# Patient Record
Sex: Female | Born: 1950 | Race: Black or African American | Hispanic: No | Marital: Single | Smoking: Former smoker
Health system: Southern US, Community
[De-identification: ages and names within clinical notes are randomized; demographics above are authoritative.]

## PROBLEM LIST (undated history)

## (undated) DIAGNOSIS — Z8619 Personal history of other infectious and parasitic diseases: Secondary | ICD-10-CM

## (undated) DIAGNOSIS — H409 Unspecified glaucoma: Secondary | ICD-10-CM

## (undated) DIAGNOSIS — E119 Type 2 diabetes mellitus without complications: Secondary | ICD-10-CM

## (undated) DIAGNOSIS — T7840XA Allergy, unspecified, initial encounter: Secondary | ICD-10-CM

## (undated) DIAGNOSIS — I10 Essential (primary) hypertension: Secondary | ICD-10-CM

## (undated) DIAGNOSIS — Z8719 Personal history of other diseases of the digestive system: Secondary | ICD-10-CM

## (undated) DIAGNOSIS — E785 Hyperlipidemia, unspecified: Secondary | ICD-10-CM

## (undated) HISTORY — PX: CARPAL TUNNEL RELEASE: SHX101

## (undated) HISTORY — DX: Type 2 diabetes mellitus without complications: E11.9

## (undated) HISTORY — DX: Personal history of other infectious and parasitic diseases: Z86.19

## (undated) HISTORY — PX: NECK SURGERY: SHX720

## (undated) HISTORY — PX: CHOLECYSTECTOMY: SHX55

## (undated) HISTORY — DX: Personal history of other diseases of the digestive system: Z87.19

## (undated) HISTORY — PX: TUBAL LIGATION: SHX77

## (undated) HISTORY — DX: Allergy, unspecified, initial encounter: T78.40XA

## (undated) HISTORY — DX: Essential (primary) hypertension: I10

## (undated) HISTORY — DX: Hyperlipidemia, unspecified: E78.5

---

## 2005-09-16 ENCOUNTER — Inpatient Hospital Stay (HOSPITAL_COMMUNITY): Admission: EM | Admit: 2005-09-16 | Discharge: 2005-09-19 | Payer: Self-pay | Admitting: Emergency Medicine

## 2006-02-16 ENCOUNTER — Ambulatory Visit (HOSPITAL_COMMUNITY): Admission: RE | Admit: 2006-02-16 | Discharge: 2006-02-16 | Payer: Self-pay | Admitting: General Surgery

## 2014-01-16 ENCOUNTER — Encounter: Payer: Self-pay | Admitting: Physician Assistant

## 2014-07-20 ENCOUNTER — Encounter: Payer: Self-pay | Admitting: Physician Assistant

## 2014-09-29 ENCOUNTER — Encounter: Payer: Self-pay | Admitting: Physician Assistant

## 2014-10-24 ENCOUNTER — Encounter: Payer: Self-pay | Admitting: Physician Assistant

## 2014-11-09 HISTORY — PX: TUMOR REMOVAL: SHX12

## 2014-11-23 ENCOUNTER — Encounter: Payer: Self-pay | Admitting: Physician Assistant

## 2014-12-18 ENCOUNTER — Encounter: Payer: Self-pay | Admitting: Physician Assistant

## 2015-08-30 ENCOUNTER — Encounter: Payer: Self-pay | Admitting: Physician Assistant

## 2017-12-21 DIAGNOSIS — H401331 Pigmentary glaucoma, bilateral, mild stage: Secondary | ICD-10-CM | POA: Diagnosis not present

## 2018-01-18 DIAGNOSIS — H401131 Primary open-angle glaucoma, bilateral, mild stage: Secondary | ICD-10-CM | POA: Diagnosis not present

## 2018-02-23 ENCOUNTER — Encounter: Payer: Self-pay | Admitting: Physician Assistant

## 2018-02-23 ENCOUNTER — Ambulatory Visit (INDEPENDENT_AMBULATORY_CARE_PROVIDER_SITE_OTHER): Payer: Medicare HMO | Admitting: Physician Assistant

## 2018-02-23 VITALS — BP 162/74 | HR 74 | Temp 97.4°F | Ht 63.0 in | Wt 200.6 lb

## 2018-02-23 DIAGNOSIS — Z0000 Encounter for general adult medical examination without abnormal findings: Secondary | ICD-10-CM

## 2018-02-23 DIAGNOSIS — I10 Essential (primary) hypertension: Secondary | ICD-10-CM

## 2018-02-23 DIAGNOSIS — E119 Type 2 diabetes mellitus without complications: Secondary | ICD-10-CM

## 2018-02-23 DIAGNOSIS — E1169 Type 2 diabetes mellitus with other specified complication: Secondary | ICD-10-CM

## 2018-02-23 DIAGNOSIS — E785 Hyperlipidemia, unspecified: Secondary | ICD-10-CM

## 2018-02-23 LAB — BAYER DCA HB A1C WAIVED: HB A1C (BAYER DCA - WAIVED): 6.1 % (ref <7.0)

## 2018-02-23 MED ORDER — LISINOPRIL-HYDROCHLOROTHIAZIDE 10-12.5 MG PO TABS: 1.0000 | ORAL_TABLET | Freq: Every day | ORAL | 3 refills | Status: DC

## 2018-02-23 NOTE — Patient Instructions (Signed)
In a few days you may receive a survey in the mail or online from [employer] regarding your visit with us today. Please take a moment to fill this out. Your feedback is very important to our whole office. It can help us better understand your needs as well as improve your experience and satisfaction. Thank you for taking your time to complete it. We care about you.  Prudy FeelerAngel Adonna Horsley, PA-C  Carbohydrate Counting for Diabetes Mellitus, Adult Carbohydrate counting is a method for keeping track of how many carbohydrates you eat. Eating carbohydrates naturally increases the amount of sugar (glucose) in the blood. Counting how many carbohydrates you eat helps keep your blood glucose within normal limits, which helps you manage your diabetes (diabetes mellitus). It is important to know how many carbohydrates you can safely have in each meal. This is different for every person. A diet and nutrition specialist (registered dietitian) can help you make a meal plan and calculate how many carbohydrates you should have at each meal and snack. Carbohydrates are found in the following foods:  Grains, such as breads and cereals.  Dried beans and soy products.  Starchy vegetables, such as potatoes, peas, and corn.  Fruit and fruit juices.  Milk and yogurt.  Sweets and snack foods, such as cake, cookies, candy, chips, and soft drinks.  How do I count carbohydrates? There are two ways to count carbohydrates in food. You can use either of the methods or a combination of both. Reading "Nutrition Facts" on packaged food The "Nutrition Facts" list is included on the labels of almost all packaged foods and beverages in the [place] It includes:  The serving size.  Information about nutrients in each serving, including the grams (g) of carbohydrate per serving.  To use the "Nutrition Facts":  Decide how many servings you will have.  Multiply the number of servings by the number of carbohydrates per serving.  The  resulting number is the total amount of carbohydrates that you will be having.  Learning standard serving sizes of other foods When you eat foods containing carbohydrates that are not packaged or do not include "Nutrition Facts" on the label, you need to measure the servings in order to count the amount of carbohydrates:  Measure the foods that you will eat with a food scale or measuring cup, if needed.  Decide how many standard-size servings you will eat.  Multiply the number of servings by 15. Most carbohydrate-rich foods have about 15 g of carbohydrates per serving. ? For example, if you eat 8 oz (170 g) of strawberries, you will have eaten 2 servings and 30 g of carbohydrates (2 servings x 15 g = 30 g).  For foods that have more than one food mixed, such as soups and casseroles, you must count the carbohydrates in each food that is included.  The following list contains standard serving sizes of common carbohydrate-rich foods. Each of these servings has about 15 g of carbohydrates:   hamburger bun or  English muffin.   oz (15 mL) syrup.   oz (14 g) jelly.  1 slice of bread.  1 six-inch tortilla.  3 oz (85 g) cooked rice or pasta.  4 oz (113 g) cooked dried beans.  4 oz (113 g) starchy vegetable, such as peas, corn, or potatoes.  4 oz (113 g) hot cereal.  4 oz (113 g) mashed potatoes or  of a large baked potato.  4 oz (113 g) canned or frozen fruit.  4  oz (120 mL) fruit juice.  4-6 crackers.  6 chicken nuggets.  6 oz (170 g) unsweetened dry cereal.  6 oz (170 g) plain fat-free yogurt or yogurt sweetened with artificial sweeteners.  8 oz (240 mL) milk.  8 oz (170 g) fresh fruit or one small piece of fruit.  24 oz (680 g) popped popcorn.  Example of carbohydrate counting Sample meal  3 oz (85 g) chicken breast.  6 oz (170 g) brown rice.  4 oz (113 g) corn.  8 oz (240 mL) milk.  8 oz (170 g) strawberries with sugar-free whipped  topping. Carbohydrate calculation 1. Identify the foods that contain carbohydrates: ? Rice. ? Corn. ? Milk. ? Strawberries. 2. Calculate how many servings you have of each food: ? 2 servings rice. ? 1 serving corn. ? 1 serving milk. ? 1 serving strawberries. 3. Multiply each number of servings by 15 g: ? 2 servings rice x 15 g = 30 g. ? 1 serving corn x 15 g = 15 g. ? 1 serving milk x 15 g = 15 g. ? 1 serving strawberries x 15 g = 15 g. 4. Add together all of the amounts to find the total grams of carbohydrates eaten: ? 30 g + 15 g + 15 g + 15 g = 75 g of carbohydrates total. This information is not intended to replace advice given to you by your health care provider. Make sure you discuss any questions you have with your health care provider. Document Released: 10/26/2005 Document Revised: 05/15/2016 Document Reviewed: 04/08/2016 Elsevier Interactive Patient Education  [employer].

## 2018-02-24 LAB — CMP14+EGFR
ALT: 13 IU/L (ref 0–32)
AST: 13 IU/L (ref 0–40)
Albumin/Globulin Ratio: 1.6 (ref 1.2–2.2)
Albumin: 4.4 g/dL (ref 3.6–4.8)
Alkaline Phosphatase: 87 IU/L (ref 39–117)
BUN/Creatinine Ratio: 15 (ref 12–28)
BUN: 11 mg/dL (ref 8–27)
Bilirubin Total: 0.2 mg/dL (ref 0.0–1.2)
CO2: 26 mmol/L (ref 20–29)
Calcium: 9.9 mg/dL (ref 8.7–10.3)
Chloride: 102 mmol/L (ref 96–106)
Creatinine, Ser: 0.75 mg/dL (ref 0.57–1.00)
GFR calc Af Amer: 97 mL/min/{1.73_m2} (ref >59)
GFR calc non Af Amer: 84 mL/min/{1.73_m2} (ref >59)
Globulin, Total: 2.8 g/dL (ref 1.5–4.5)
Glucose: 105 mg/dL — ABNORMAL HIGH (ref 65–99)
Potassium: 5.1 mmol/L (ref 3.5–5.2)
Sodium: 143 mmol/L (ref 134–144)
Total Protein: 7.2 g/dL (ref 6.0–8.5)

## 2018-02-24 LAB — LIPID PANEL
Chol/HDL Ratio: 5 ratio — ABNORMAL HIGH (ref 0.0–4.4)
Cholesterol, Total: 218 mg/dL — ABNORMAL HIGH (ref 100–199)
HDL: 44 mg/dL (ref >39)
LDL Calculated: 145 mg/dL — ABNORMAL HIGH (ref 0–99)
Triglycerides: 146 mg/dL (ref 0–149)
VLDL Cholesterol Cal: 29 mg/dL (ref 5–40)

## 2018-02-24 LAB — CBC WITH DIFFERENTIAL/PLATELET
Basophils Absolute: 0 10*3/uL (ref 0.0–0.2)
Basos: 0 %
EOS (ABSOLUTE): 0.4 10*3/uL (ref 0.0–0.4)
Eos: 6 %
Hematocrit: 38.5 % (ref 34.0–46.6)
Hemoglobin: 12.4 g/dL (ref 11.1–15.9)
Immature Grans (Abs): 0 10*3/uL (ref 0.0–0.1)
Immature Granulocytes: 0 %
Lymphocytes Absolute: 2.2 10*3/uL (ref 0.7–3.1)
Lymphs: 33 %
MCH: 26.6 pg (ref 26.6–33.0)
MCHC: 32.2 g/dL (ref 31.5–35.7)
MCV: 83 fL (ref 79–97)
Monocytes Absolute: 0.4 10*3/uL (ref 0.1–0.9)
Monocytes: 7 %
Neutrophils Absolute: 3.6 10*3/uL (ref 1.4–7.0)
Neutrophils: 54 %
Platelets: 332 10*3/uL (ref 150–379)
RBC: 4.66 x10E6/uL (ref 3.77–5.28)
RDW: 14.7 % (ref 12.3–15.4)
WBC: 6.6 10*3/uL (ref 3.4–10.8)

## 2018-02-24 LAB — TSH: TSH: 2.35 u[IU]/mL (ref 0.450–4.500)

## 2018-02-24 NOTE — Progress Notes (Signed)
BP (!) 162/74   Pulse 74   Temp (!) 97.4 F (36.3 C) (Oral)   Ht _0  (1.6 m)   Wt 200 lb 9.6 oz (91 kg)   BMI 35.53 kg/m    Subjective:    Patient ID: Debra Patton, female    DOB: 1951/04/05, 67 y.o.   MRN: [id number]  HPI: Debra Patton is a 67 y.o. female presenting on 02/23/2018 for New Patient (Initial Visit) and Establish Care  This patient comes in to be established with [place].  She had been seeing a physician in the evening.  She overall is doing fairly well.  She noted that she had a lot of arthralgias during the time that she was taken a statin.  She stopped all of her medications.  She had type 2 diabetes that was not severely out of control.  We will have labs drawn today to see what her status is with this.  We discussed her hypertension, diabetes, hyperlipidemia.  We have made a plan to start her hypertension medication back now and then add a statin in a couple of weeks.  That way she can discern if there is a link to her side effects.  In 2016 patient had neck surgery.  It was related to a trauma that was work-related.  She has some residual pain that goes down the right side the left side feels fairly clear most of the time.   Past Medical History:  Diagnosis Date  . Allergy   . Diabetes mellitus without complication ([place])   . History of acute pancreatitis   . History of herpes zoster   . Hyperlipidemia   . Hypertension    Relevant past medical, surgical, family and social history reviewed and updated as indicated. Interim medical history since our last visit reviewed. Allergies and medications reviewed and updated. DATA REVIEWED: CHART IN EPIC  Family History reviewed for pertinent findings.  Review of Systems  Constitutional: Negative.  Negative for activity change, fatigue and fever.  HENT: Negative.   Eyes: Negative.   Respiratory: Negative.  Negative for cough.   Cardiovascular: Negative.  Negative for chest pain.  Gastrointestinal: Negative.   Negative for abdominal pain.  Endocrine: Negative.   Genitourinary: Negative.  Negative for dysuria.  Musculoskeletal: Positive for joint swelling.  Skin: Negative.   Neurological: Negative.  Negative for dizziness, weakness, light-headedness and numbness.    Allergies as of 02/23/2018      Reactions   Ivp Dye [iodinated Diagnostic Agents]       Medication List        Accurate as of 02/23/18 11:59 PM. Always use your most recent med list.          acetaminophen 500 MG tablet Commonly known as:  TYLENOL Take 500 mg by mouth every 6 (six) hours as needed.   aspirin-sod bicarb-citric acid 325 MG Tbef tablet Commonly known as:  ALKA-SELTZER Take 325 mg by mouth every 6 (six) hours as needed.   diphenhydramine-acetaminophen 25-500 MG Tabs tablet Commonly known as:  TYLENOL PM Take 1 tablet by mouth at bedtime as needed.   latanoprost 0.005 % ophthalmic solution Commonly known as:  XALATAN TAKE 1 DROP(S) IN BOTH EYES ONCE IN THE EVENING   lisinopril-hydrochlorothiazide 10-12.5 MG tablet Commonly known as:  PRINZIDE,ZESTORETIC Take 1 tablet by mouth daily.          Objective:    BP (!) 162/74   Pulse 74   Temp (!) 97.4  F (36.3 C) (Oral)   Ht _0  (1.6 m)   Wt 200 lb 9.6 oz (91 kg)   BMI 35.53 kg/m   Allergies  Allergen Reactions  . Ivp Dye [Iodinated Diagnostic Agents]     Wt Readings from Last 3 Encounters:  02/23/18 200 lb 9.6 oz (91 kg)    Physical Exam  Constitutional: She is oriented to person, place, and time. She appears well-developed and well-nourished.  HENT:  Head: Normocephalic and atraumatic.  Right Ear: Tympanic membrane, external ear and ear canal normal.  Left Ear: Tympanic membrane, external ear and ear canal normal.  Nose: Nose normal. No rhinorrhea.  Mouth/Throat: Oropharynx is clear and moist and mucous membranes are normal. No oropharyngeal exudate or posterior oropharyngeal erythema.  Eyes: Pupils are equal, round, and reactive to  light. Conjunctivae and EOM are normal.  Neck: Normal range of motion. Neck supple.  Cardiovascular: Normal rate, regular rhythm, normal heart sounds and intact distal pulses.  Pulmonary/Chest: Effort normal and breath sounds normal.  Abdominal: Soft. Bowel sounds are normal.  Neurological: She is alert and oriented to person, place, and time. She has normal reflexes.  Skin: Skin is warm and dry. No rash noted.     Scar on left neck from trauma.  Glass removed in 2016, wound is well-healed  Psychiatric: She has a normal mood and affect. Her behavior is normal. Judgment and thought content normal.    Results for orders placed or performed in visit on 02/23/18  CBC with Differential/Platelet  Result Value Ref Range   WBC 6.6 3.4 - 10.8 x10E3/uL   RBC 4.66 3.77 - 5.28 x10E6/uL   Hemoglobin 12.4 11.1 - 15.9 g/dL   Hematocrit 38.5 34.0 - 46.6 %   MCV 83 79 - 97 fL   MCH 26.6 26.6 - 33.0 pg   MCHC 32.2 31.5 - 35.7 g/dL   RDW 14.7 12.3 - 15.4 %   Platelets 332 150 - 379 x10E3/uL   Neutrophils 54 Not Estab. %   Lymphs 33 Not Estab. %   Monocytes 7 Not Estab. %   Eos 6 Not Estab. %   Basos 0 Not Estab. %   Neutrophils Absolute 3.6 1.4 - 7.0 x10E3/uL   Lymphocytes Absolute 2.2 0.7 - 3.1 x10E3/uL   Monocytes Absolute 0.4 0.1 - 0.9 x10E3/uL   EOS (ABSOLUTE) 0.4 0.0 - 0.4 x10E3/uL   Basophils Absolute 0.0 0.0 - 0.2 x10E3/uL   Immature Granulocytes 0 Not Estab. %   Immature Grans (Abs) 0.0 0.0 - 0.1 x10E3/uL  CMP14+EGFR  Result Value Ref Range   Glucose 105 (H) 65 - 99 mg/dL   BUN 11 8 - 27 mg/dL   Creatinine, Ser 0.75 0.57 - 1.00 mg/dL   GFR calc non Af Amer 84 >59 mL/min/1.73   GFR calc Af Amer 97 >59 mL/min/1.73   BUN/Creatinine Ratio 15 12 - 28   Sodium 143 134 - 144 mmol/L   Potassium 5.1 3.5 - 5.2 mmol/L   Chloride 102 96 - 106 mmol/L   CO2 26 20 - 29 mmol/L   Calcium 9.9 8.7 - 10.3 mg/dL   Total Protein 7.2 6.0 - 8.5 g/dL   Albumin 4.4 3.6 - 4.8 g/dL   Globulin, Total 2.8  1.5 - 4.5 g/dL   Albumin/Globulin Ratio 1.6 1.2 - 2.2   Bilirubin Total 0.2 0.0 - 1.2 mg/dL   Alkaline Phosphatase 87 39 - 117 IU/L   AST 13 0 - 40 IU/L   ALT  13 0 - 32 IU/L  Lipid panel  Result Value Ref Range   Cholesterol, Total 218 (H) 100 - 199 mg/dL   Triglycerides 146 0 - 149 mg/dL   HDL 44 >39 mg/dL   VLDL Cholesterol Cal 29 5 - 40 mg/dL   LDL Calculated 145 (H) 0 - 99 mg/dL   Chol/HDL Ratio 5.0 (H) 0.0 - 4.4 ratio  TSH  Result Value Ref Range   TSH 2.350 0.450 - 4.500 uIU/mL  Bayer DCA Hb A1c Waived  Result Value Ref Range   Bayer DCA Hb A1c Waived 6.1 <7.0 %      Assessment & Plan:   1. Diabetes mellitus without complication ([place]) - CBC with Differential/Platelet - CMP14+EGFR - Lipid panel - TSH - Bayer DCA Hb A1c Waived  2. Essential hypertension - lisinopril-hydrochlorothiazide (PRINZIDE,ZESTORETIC) 10-12.5 MG tablet; Take 1 tablet by mouth daily.  Dispense: 90 tablet; Refill: 3  3. Hyperlipidemia associated with type 2 diabetes mellitus ([place]) - CBC with Differential/Platelet - CMP14+EGFR - Lipid panel - TSH - Bayer DCA Hb A1c Waived  4. Well adult exam - CBC with Differential/Platelet - CMP14+EGFR - Lipid panel - TSH - Bayer DCA Hb A1c Waived   Continue all other maintenance medications as listed above.  Follow up plan: Return in about 1 month (around 03/23/2018) for recheck.  Educational handout given for [place] PA-C [facility] [address]  [place], [place] [address] [phone]   02/24/2018, 11:27 AM

## 2018-03-02 ENCOUNTER — Other Ambulatory Visit: Payer: Self-pay | Admitting: Physician Assistant

## 2018-03-02 MED ORDER — PRAVASTATIN SODIUM 20 MG PO TABS: 20.0000 mg | ORAL_TABLET | Freq: Every day | ORAL | 3 refills | Status: DC

## 2018-03-25 ENCOUNTER — Encounter: Payer: Self-pay | Admitting: Physician Assistant

## 2018-03-25 ENCOUNTER — Ambulatory Visit (INDEPENDENT_AMBULATORY_CARE_PROVIDER_SITE_OTHER): Payer: Medicare HMO

## 2018-03-25 ENCOUNTER — Ambulatory Visit (INDEPENDENT_AMBULATORY_CARE_PROVIDER_SITE_OTHER): Payer: Medicare HMO | Admitting: Physician Assistant

## 2018-03-25 VITALS — BP 116/65 | HR 67 | Temp 97.2°F | Ht 63.0 in | Wt 196.8 lb

## 2018-03-25 DIAGNOSIS — Z0000 Encounter for general adult medical examination without abnormal findings: Secondary | ICD-10-CM

## 2018-03-25 DIAGNOSIS — E1169 Type 2 diabetes mellitus with other specified complication: Secondary | ICD-10-CM | POA: Diagnosis not present

## 2018-03-25 DIAGNOSIS — E785 Hyperlipidemia, unspecified: Secondary | ICD-10-CM | POA: Diagnosis not present

## 2018-03-25 DIAGNOSIS — Z780 Asymptomatic menopausal state: Secondary | ICD-10-CM | POA: Diagnosis not present

## 2018-03-25 DIAGNOSIS — E119 Type 2 diabetes mellitus without complications: Secondary | ICD-10-CM

## 2018-03-25 LAB — LIPID PANEL
Chol/HDL Ratio: 4.2 ratio (ref 0.0–4.4)
Cholesterol, Total: 173 mg/dL (ref 100–199)
HDL: 41 mg/dL (ref >39)
LDL Calculated: 113 mg/dL — ABNORMAL HIGH (ref 0–99)
Triglycerides: 95 mg/dL (ref 0–149)
VLDL Cholesterol Cal: 19 mg/dL (ref 5–40)

## 2018-03-25 NOTE — Patient Instructions (Signed)
In a few days you may receive a survey in the mail or online from [employer] regarding your visit with us today. Please take a moment to fill this out. Your feedback is very important to our whole office. It can help us better understand your needs as well as improve your experience and satisfaction. Thank you for taking your time to complete it. We care about you.  Carlyle Mcelrath, PA-C  

## 2018-03-27 NOTE — Progress Notes (Signed)
BP 116/65   Pulse 67   Temp (!) 97.2 F (36.2 C) (Oral)   Ht  (1.6 m)   Wt 196 lb 12.8 oz (89.3 kg)   BMI 34.86 kg/m    Subjective:    Patient ID: Debra Patton, female    DOB: 04-08-1951, 67 y.o.   MRN: [id number]  HPI: DERENDA GIDDINGS is a 67 y.o. female presenting on 03/25/2018 for Diabetes (1 month follow up) and Hypertension  This patient comes in for recheck on her hyperlipidemia and diabetes.  She reports that she is doing well overall she has lost several pounds and is doing a really good job with her diet and exercise.  She is tolerating her Pravachol very well not having any difficulty with the medication.  We will plan to recheck things in 6 months.  Her A1c was very good at last check.  Past Medical History:  Diagnosis Date  . Allergy   . Diabetes mellitus without complication ([facility])   . History of acute pancreatitis   . History of herpes zoster   . Hyperlipidemia   . Hypertension    Relevant past medical, surgical, family and social history reviewed and updated as indicated. Interim medical history since our last visit reviewed. Allergies and medications reviewed and updated. DATA REVIEWED: CHART IN EPIC  Family History reviewed for pertinent findings.  Review of Systems  Constitutional: Negative.   HENT: Negative.   Eyes: Negative.   Respiratory: Negative.   Gastrointestinal: Negative.   Genitourinary: Negative.     Allergies as of 03/25/2018      Reactions   Ivp Dye [iodinated Diagnostic Agents]       Medication List        Accurate as of 03/25/18 11:59 PM. Always use your most recent med list.          acetaminophen 500 MG tablet Commonly known as:  TYLENOL Take 500 mg by mouth every 6 (six) hours as needed.   aspirin-sod bicarb-citric acid 325 MG Tbef tablet Commonly known as:  ALKA-SELTZER Take 325 mg by mouth every 6 (six) hours as needed.   diphenhydramine-acetaminophen 25-500 MG Tabs tablet Commonly known as:  TYLENOL  PM Take 1 tablet by mouth at bedtime as needed.   latanoprost 0.005 % ophthalmic solution Commonly known as:  XALATAN TAKE 1 DROP(S) IN BOTH EYES ONCE IN THE EVENING   lisinopril-hydrochlorothiazide 10-12.5 MG tablet Commonly known as:  PRINZIDE,ZESTORETIC Take 1 tablet by mouth daily.   pravastatin 20 MG tablet Commonly known as:  PRAVACHOL Take 1 tablet (20 mg total) by mouth daily.          Objective:    BP 116/65   Pulse 67   Temp (!) 97.2 F (36.2 C) (Oral)   Ht  (1.6 m)   Wt 196 lb 12.8 oz (89.3 kg)   BMI 34.86 kg/m   Allergies  Allergen Reactions  . Ivp Dye [Iodinated Diagnostic Agents]     Wt Readings from Last 3 Encounters:  03/25/18 196 lb 12.8 oz (89.3 kg)  02/23/18 200 lb 9.6 oz (91 kg)    Physical Exam  Constitutional: She is oriented to person, place, and time. She appears well-developed and well-nourished.  HENT:  Head: Normocephalic and atraumatic.  Eyes: Pupils are equal, round, and reactive to light. Conjunctivae and EOM are normal.  Cardiovascular: Normal rate, regular rhythm, normal heart sounds and intact distal pulses.  Pulmonary/Chest: Effort normal and breath sounds  normal.  Abdominal: Soft. Bowel sounds are normal.  Neurological: She is alert and oriented to person, place, and time. She has normal reflexes.  Skin: Skin is warm and dry. No rash noted.  Psychiatric: She has a normal mood and affect. Her behavior is normal. Judgment and thought content normal.    Results for orders placed or performed in visit on 03/25/18  Lipid panel  Result Value Ref Range   Cholesterol, Total 173 100 - 199 mg/dL   Triglycerides 95 0 - 149 mg/dL   HDL 41 >40 mg/dL   VLDL Cholesterol Cal 19 5 - 40 mg/dL   LDL Calculated 981 (H) 0 - 99 mg/dL   Chol/HDL Ratio 4.2 0.0 - 4.4 ratio      Assessment & Plan:   1. Well adult exam - DG [facility] DEXA  2. Hyperlipidemia associated with type 2 diabetes mellitus (H[facility]) - Lipid panel  3. Diabetes mellitus  without complication ([facility]) - Bayer DCA Hb A1c Waived; Future   Continue all other maintenance medications as listed above.  Follow up plan: Return in about 6 months (around 09/25/2018) for recheck.  Educational handout given for survey  Remus Loffler PA-C [facility] [address]  [place], [place] [address] [phone]   03/27/2018, 10:02 PM

## 2018-03-28 DIAGNOSIS — Z13820 Encounter for screening for osteoporosis: Secondary | ICD-10-CM | POA: Diagnosis not present

## 2018-03-28 DIAGNOSIS — Z780 Asymptomatic menopausal state: Secondary | ICD-10-CM | POA: Diagnosis not present

## 2018-04-28 ENCOUNTER — Encounter: Payer: Self-pay | Admitting: Physician Assistant

## 2018-04-28 ENCOUNTER — Ambulatory Visit (INDEPENDENT_AMBULATORY_CARE_PROVIDER_SITE_OTHER): Payer: Medicare HMO | Admitting: Physician Assistant

## 2018-04-28 VITALS — BP 119/66 | HR 68 | Temp 97.5°F | Ht 63.0 in | Wt 195.0 lb

## 2018-04-28 DIAGNOSIS — Z0000 Encounter for general adult medical examination without abnormal findings: Secondary | ICD-10-CM | POA: Diagnosis not present

## 2018-04-28 NOTE — Progress Notes (Signed)
Subjective:    Debra Patton is a 67 y.o. female who presents for a Welcome to Medicare exam.   Review of Systems Review of Systems  Constitutional: Negative.   HENT: Negative.   Respiratory: Negative.  Negative for cough, shortness of breath and wheezing.   Cardiovascular: Negative for chest pain and orthopnea.  Gastrointestinal: Positive for constipation.  Genitourinary: Negative.   Musculoskeletal: Positive for joint pain and myalgias.  Skin: Negative.   Neurological: Negative.   Psychiatric/Behavioral: Negative.     Cardiac Risk Factors include: advanced age (>58men, >86 women);diabetes mellitus;hypertension      Objective:    Today's Vitals   04/28/18 0902  BP: 119/66  Pulse: 68  Temp: (!) 97.5 F (36.4 C)  TempSrc: Oral  Weight: 195 lb (88.5 kg)  Height: 5\' 3"  (1.6 m)  Body mass index is 34.54 kg/m.  Medications Outpatient Encounter Medications as of 04/28/2018  Medication Sig  . acetaminophen (TYLENOL) 500 MG tablet Take 500 mg by mouth every 6 (six) hours as needed.  Marland Kitchen aspirin-sod bicarb-citric acid (ALKA-SELTZER) 325 MG TBEF tablet Take 325 mg by mouth every 6 (six) hours as needed.  . diphenhydramine-acetaminophen (TYLENOL PM) 25-500 MG TABS tablet Take 1 tablet by mouth at bedtime as needed.  . latanoprost (XALATAN) 0.005 % ophthalmic solution TAKE 1 DROP(S) IN BOTH EYES ONCE IN THE EVENING  . lisinopril-hydrochlorothiazide (PRINZIDE,ZESTORETIC) 10-12.5 MG tablet Take 1 tablet by mouth daily.  . pravastatin (PRAVACHOL) 20 MG tablet Take 1 tablet (20 mg total) by mouth daily.   No facility-administered encounter medications on file as of 04/28/2018.      History: Past Medical History:  Diagnosis Date  . Allergy   . Diabetes mellitus without complication (HCC)   . History of acute pancreatitis   . History of herpes zoster   . Hyperlipidemia   . Hypertension    Past Surgical History:  Procedure Laterality Date  . CARPAL TUNNEL RELEASE    .  CHOLECYSTECTOMY    . NECK SURGERY    . TUBAL LIGATION    . TUMOR REMOVAL Left 2016   Neck     Family History  Problem Relation Age of Onset  . Hypertension Mother   . Diabetes Mother   . Stroke Mother   . Vision loss Mother   . Asthma Mother   . Alcohol abuse Father   . Cancer Father   . Stroke Father   . Hypertension Sister   . Early death Brother   . Cancer Brother        Brain tumor   . Diabetes Sister   . Hypertension Sister   . Cancer Brother   . Early death Brother   . Diabetes Brother   . Hypertension Brother   . Hypertension Brother   . Cancer Brother   . Diabetes Brother   . Hypertension Brother   . Cancer Brother   . Diabetes Brother   . Hypertension Brother   . Vision loss Brother   . Stroke Brother    Social History   Occupational History  . Not on file  Tobacco Use  . Smoking status: Former Smoker    Last attempt to quit: 11/09/2005    Years since quitting: 12.4  . Smokeless tobacco: Never Used  Substance and Sexual Activity  . Alcohol use: Never    Frequency: Never  . Drug use: Never  . Sexual activity: Not Currently    Tobacco Counseling Counseling given: Not Answered  Immunizations and Health Maintenance Immunization History  Administered Date(s) Administered  . Tdap 04/27/2017   Health Maintenance Due  Topic Date Due  . Hepatitis C Screening  03/02/1951  . FOOT EXAM  10/31/1962  . OPHTHALMOLOGY EXAM  10/31/1962  . HIV Screening  11/01/1967  . PAP SMEAR  10/31/1973  . MAMMOGRAM  12/18/2016    Activities of Daily Living In your present state of health, do you have any difficulty performing the following activities: 04/28/2018  Hearing? N  Vision? N  Comment Patient does wear glasses   Difficulty concentrating or making decisions? Y  Comment Patient states at time that she forgets where she lays something down  Walking or climbing stairs? N  Dressing or bathing? N  Doing errands, shopping? N  Preparing Food and eating ? N    Using the Toilet? N  In the past six months, have you accidently leaked urine? N  Do you have problems with loss of bowel control? N  Managing your Medications? N  Managing your Finances? N  Housekeeping or managing your Housekeeping? N  Some recent data might be hidden   Physical Exam  Constitutional: She is oriented to person, place, and time and well-developed, well-nourished, and in no distress. No distress.  HENT:  Head: Normocephalic and atraumatic.  Mouth/Throat: Oropharynx is clear and moist.  Eyes: Pupils are equal, round, and reactive to light. Conjunctivae and EOM are normal. Right eye exhibits no discharge. Left eye exhibits no discharge.  Neck: Normal range of motion. Neck supple.  Cardiovascular: Normal rate, regular rhythm and normal heart sounds.  Pulmonary/Chest: Effort normal and breath sounds normal. She has no wheezes.  Abdominal: Soft. Bowel sounds are normal.  Musculoskeletal: Normal range of motion.  Neurological: She is alert and oriented to person, place, and time.  Skin: Skin is warm and dry. She is not diaphoretic.  Psychiatric: Affect and judgment normal.  Nursing note and vitals reviewed.      Advanced Directives: Does Patient Have a Medical Advance Directive?: No Would patient like information on creating a medical advance directive?: Yes (MAU/Ambulatory/Procedural Areas - Information given)    Assessment:    This is a routine wellness examination for this patient .   Vision/Hearing screen No exam data present  Dietary issues and exercise activities discussed:  Current Exercise Habits: Home exercise routine, Type of exercise: walking, Time (Minutes): 30, Frequency (Times/Week): 3, Weekly Exercise (Minutes/Week): 90, Intensity: Mild  Goals    None     Depression Screen PHQ 2/9 Scores 04/28/2018 03/25/2018 02/23/2018  PHQ - 2 Score 0 0 0     Fall Risk Fall Risk  04/28/2018  Falls in the past year? No    Cognitive Function: MMSE - Mini  Mental State Exam 04/28/2018  Orientation to time 5  Orientation to Place 5  Registration 3  Attention/ Calculation 5  Recall 3  Language- name 2 objects 2  Language- repeat 1  Language- follow 3 step command 3  Language- read & follow direction 1  Write a sentence 1  Copy design 1  Total score 30        Patient Care Team: Caryl Never as PCP - General (Physician Assistant)     Plan:   [place] TO MEDICARE EXAM  I have personally reviewed and noted the following in the patient's chart:   . Medical and social history . Use of alcohol, tobacco or illicit drugs  . Current medications and supplements . Functional ability  and status . Nutritional status . Physical activity . Advanced directives . List of other physicians . Hospitalizations, surgeries, and ER visits in previous 12 months . Vitals . Screenings to include cognitive, depression, and falls . Referrals and appointments  In addition, I have reviewed and discussed with patient certain preventive protocols, quality metrics, and best practice recommendations. A written personalized care plan for preventive services as well as general preventive health recommendations were provided to patient.     Remus LofflerAngel S Chrystian Ressler, PA-C 04/28/2018

## 2018-04-28 NOTE — Patient Instructions (Signed)
Your child needs to take Miralax, a powder that you mix in a clear liquid.   1. Stir the Miralax powder into water, juice, or Gatorade. Your child's Miralax dose is:   8 capfuls of Miralax powder in 32 to 64 ounces of liquid  2. Give your child 4 to 8 ounces to drink every 30 minutes. It will take 4 to 6 hours for your child to finish the medicine. 3. After the medicine is gone, have your child drink more water or juice. This will help with the cleanout.  After the clean out, your child will take a daily (maintenance) medicine for at least 6 months. 1 capful of powder in 8 ounces of liquid every day  Your child may have stomach pain or cramping during the clean out. This might mean your child has to go to the bathroom. Have your child sit on the toilet. Explain that the pain will go away when the stool is gone. You may want to read to your child while you wait. A warm bath may also help.  Your child should have almost clear liquid stools by the end of the next day.   

## 2018-07-29 ENCOUNTER — Ambulatory Visit (INDEPENDENT_AMBULATORY_CARE_PROVIDER_SITE_OTHER): Payer: Medicare HMO

## 2018-07-29 ENCOUNTER — Encounter: Payer: Self-pay | Admitting: Pediatrics

## 2018-07-29 ENCOUNTER — Ambulatory Visit (INDEPENDENT_AMBULATORY_CARE_PROVIDER_SITE_OTHER): Payer: Medicare HMO | Admitting: Pediatrics

## 2018-07-29 ENCOUNTER — Encounter (INDEPENDENT_AMBULATORY_CARE_PROVIDER_SITE_OTHER): Payer: Self-pay

## 2018-07-29 VITALS — BP 125/70 | HR 60 | Temp 97.6°F | Ht 63.0 in | Wt 191.6 lb

## 2018-07-29 DIAGNOSIS — M79644 Pain in right finger(s): Secondary | ICD-10-CM

## 2018-07-29 DIAGNOSIS — M19041 Primary osteoarthritis, right hand: Secondary | ICD-10-CM | POA: Diagnosis not present

## 2018-07-29 MED ORDER — NAPROXEN 500 MG PO TABS: 500.0000 mg | ORAL_TABLET | Freq: Two times a day (BID) | ORAL | 0 refills | Status: DC

## 2018-07-29 NOTE — Progress Notes (Signed)
  Subjective:   Patient ID: Debra Patton, female    DOB: October 13, 1951, 67 y.o.   MRN: [id number] CC: Hand Pain (Right)  HPI: Debra PrimroseRoberta B Redwine is a 67 y.o. female   Right second finger pain started 2 nights ago when washing dishes.  She noticed that her second and third finger was stuck together.  She had to pry them apart forcing the fingers laterally with pain to the index finger.  Since then she is able to make a fist with fingers 1,3,4 and 5, not able to fully bend second finger fully straighten it.  Slight swelling present in the hand.  She is never had gout before.  No other no injury she can think of.  She points to the dorsum of the second finger with where the pain is the greatest.  Relevant past medical, surgical, family and social history reviewed. Allergies and medications reviewed and updated. Social History   Tobacco Use  Smoking Status Former Smoker  . Last attempt to quit: 11/09/2005  . Years since quitting: 12.7  Smokeless Tobacco Never Used   ROS: Per HPI   Objective:    BP 125/70   Pulse 60   Temp 97.6 F (36.4 C) (Oral)   Ht 5\' 3"  (1.6 m)   Wt 191 lb 9.6 oz (86.9 kg)   BMI 33.94 kg/m   Wt Readings from Last 3 Encounters:  07/29/18 191 lb 9.6 oz (86.9 kg)  04/28/18 195 lb (88.5 kg)  03/25/18 196 lb 12.8 oz (89.3 kg)    Gen: NAD, alert, cooperative with exam, NCAT EYES: EOMI, no conjunctival injection, or no icterus CV: NRRR, normal S1/S2, no murmur, distal pulses 2+ b/l Resp: CTABL, no wheezes, normal WOB Ext: No edema, warm Neuro: Alert and oriented, strength equal b/l UE and LE, coordination grossly normal MSK: Right hand: Second and third MCP joints appears slightly swollen.  No point tenderness over metacarpals.  No pain with squeezing MCP joints right hand.  No pain palpating phalanx bones and fingers.  Mildly tender to palpation palm surface over second finger flexural tendons proximal to MCP joint.  Can slightly bend 2nd finger at DIP PIP and MCP  joints.  Full flexion limited by pain. Skin: No cuts or scrapes, induration of the hand or fingers.  Small mole over R second MCP that patient says is not changed  Assessment & Plan:  Jenel LucksRoberta was seen today for hand pain.  Diagnoses and all orders for this visit:  Finger pain, right Initial read of x-rays normal, will follow-up final radiology read.  Possible that she injured finger 2 nights ago during episode of finger sticking.  Also possible she has a trigger finger but was able to somewhat bend the finger, no sticking point and pain is mostly on the dorsum of the finger.  Recommended a week of anti-inflammatory medicines, if pain worsening or not improving return to clinic next week. -     DG Finger Index Right; Future -     naproxen (NAPROSYN) 500 MG tablet; Take 1 tablet (500 mg total) by mouth 2 (two) times daily with a meal.   Follow up plan: 1 week if not improving. Rex Krasarol Aulden Calise, MD [facility]

## 2018-09-26 ENCOUNTER — Ambulatory Visit: Payer: Medicare HMO | Admitting: Physician Assistant

## 2019-01-05 ENCOUNTER — Encounter: Payer: Self-pay | Admitting: Physician Assistant

## 2019-01-05 ENCOUNTER — Ambulatory Visit (INDEPENDENT_AMBULATORY_CARE_PROVIDER_SITE_OTHER): Payer: Medicare HMO | Admitting: Physician Assistant

## 2019-01-05 VITALS — BP 118/62 | HR 71 | Temp 97.1°F | Ht 63.0 in | Wt 190.2 lb

## 2019-01-05 DIAGNOSIS — E1169 Type 2 diabetes mellitus with other specified complication: Secondary | ICD-10-CM | POA: Diagnosis not present

## 2019-01-05 DIAGNOSIS — M79601 Pain in right arm: Secondary | ICD-10-CM | POA: Diagnosis not present

## 2019-01-05 DIAGNOSIS — E785 Hyperlipidemia, unspecified: Secondary | ICD-10-CM | POA: Diagnosis not present

## 2019-01-05 DIAGNOSIS — I10 Essential (primary) hypertension: Secondary | ICD-10-CM | POA: Diagnosis not present

## 2019-01-05 DIAGNOSIS — M79644 Pain in right finger(s): Secondary | ICD-10-CM

## 2019-01-05 DIAGNOSIS — Z0000 Encounter for general adult medical examination without abnormal findings: Secondary | ICD-10-CM | POA: Diagnosis not present

## 2019-01-05 LAB — BAYER DCA HB A1C WAIVED: HB A1C: 6.2 % (ref <7.0)

## 2019-01-05 MED ORDER — PRAVASTATIN SODIUM 20 MG PO TABS
20.0000 mg | ORAL_TABLET | Freq: Every day | ORAL | 3 refills | Status: DC
Start: 2019-01-05 — End: 2019-05-29

## 2019-01-05 MED ORDER — LISINOPRIL-HYDROCHLOROTHIAZIDE 10-12.5 MG PO TABS: 1.0000 | ORAL_TABLET | Freq: Every day | ORAL | 3 refills | Status: DC

## 2019-01-05 MED ORDER — NAPROXEN 500 MG PO TABS: 500.0000 mg | ORAL_TABLET | Freq: Two times a day (BID) | ORAL | 3 refills | Status: DC

## 2019-01-05 NOTE — Progress Notes (Signed)
BP 118/62   Pulse 71   Temp (!) 97.1 F (36.2 C) (Oral)   Ht 5' 3"  (1.6 m)   Wt 190 lb 3.2 oz (86.3 kg)   BMI 33.69 kg/m    Subjective:    Patient ID: Debra Patton, female    DOB: [birth year], 68 y.o.   MRN: [id number]  HPI: Debra Patton is a 68 y.o. female presenting on 01/05/2019 for Diabetes; Hyperlipidemia; and Hypertension  This patient comes in for periodic recheck on medications and conditions including hypertension, hyperlipidemia, chronic pain in arm and finger.  She has some pain in the arm and hand, she uses her right side a lot because of the limits she has in the left arm from the musculoskeletal and neurologic damage from the traumatic injury.  She reports that her overall movement and ROM are normal .  All medications are reviewed today. There are no reports of any problems with the medications. All of the medical conditions are reviewed and updated.  Lab work is reviewed and will be ordered as medically necessary. There are no new problems reported with today's visit.   Past Medical History:  Diagnosis Date  . Allergy   . Diabetes mellitus without complication ([place])   . History of acute pancreatitis   . History of herpes zoster   . Hyperlipidemia   . Hypertension    Relevant past medical, surgical, family and social history reviewed and updated as indicated. Interim medical history since our last visit reviewed. Allergies and medications reviewed and updated. DATA REVIEWED: CHART IN EPIC  Family History reviewed for pertinent findings.  Review of Systems  Constitutional: Negative.  Negative for activity change, fatigue and fever.  HENT: Negative.   Eyes: Negative.   Respiratory: Negative.  Negative for cough, shortness of breath and wheezing.   Cardiovascular: Negative.  Negative for chest pain.  Gastrointestinal: Negative.  Negative for abdominal pain.  Endocrine: Negative.   Genitourinary: Negative.  Negative for dysuria.  Musculoskeletal:  Positive for arthralgias, joint swelling and myalgias.  Skin: Negative.   Neurological: Negative.     Allergies as of 01/05/2019      Reactions   Ivp Dye [iodinated Diagnostic Agents]       Medication List       Accurate as of January 05, 2019 11:59 PM. Always use your most recent med list.        acetaminophen 500 MG tablet Commonly known as:  TYLENOL Take 500 mg by mouth every 6 (six) hours as needed.   aspirin-sod bicarb-citric acid 325 MG Tbef tablet Commonly known as:  ALKA-SELTZER Take 325 mg by mouth every 6 (six) hours as needed.   diphenhydramine-acetaminophen 25-500 MG Tabs tablet Commonly known as:  TYLENOL PM Take 1 tablet by mouth at bedtime as needed.   latanoprost 0.005 % ophthalmic solution Commonly known as:  XALATAN TAKE 1 DROP(S) IN BOTH EYES ONCE IN THE EVENING   lisinopril-hydrochlorothiazide 10-12.5 MG tablet Commonly known as:  PRINZIDE,ZESTORETIC Take 1 tablet by mouth daily.   naproxen 500 MG tablet Commonly known as:  NAPROSYN Take 1 tablet (500 mg total) by mouth 2 (two) times daily with a meal.   pravastatin 20 MG tablet Commonly known as:  PRAVACHOL Take 1 tablet (20 mg total) by mouth daily.          Objective:    BP 118/62   Pulse 71   Temp (!) 97.1 F (36.2 C) (Oral)   Ht 5' 3"  (  1.6 m)   Wt 190 lb 3.2 oz (86.3 kg)   BMI 33.69 kg/m   Allergies  Allergen Reactions  . Ivp Dye [Iodinated Diagnostic Agents]     Wt Readings from Last 3 Encounters:  01/05/19 190 lb 3.2 oz (86.3 kg)  07/29/18 191 lb 9.6 oz (86.9 kg)  04/28/18 195 lb (88.5 kg)    Physical Exam Constitutional:      Appearance: She is well-developed.  HENT:     Head: Normocephalic and atraumatic.  Eyes:     Conjunctiva/sclera: Conjunctivae normal.     Pupils: Pupils are equal, round, and reactive to light.  Cardiovascular:     Rate and Rhythm: Normal rate and regular rhythm.     Heart sounds: Normal heart sounds.  Pulmonary:     Effort: Pulmonary  effort is normal.     Breath sounds: Normal breath sounds.  Abdominal:     General: Bowel sounds are normal.     Palpations: Abdomen is soft.  Musculoskeletal:     Right shoulder: She exhibits decreased range of motion, tenderness, pain and spasm.  Skin:    General: Skin is warm and dry.     Findings: No rash.  Neurological:     Mental Status: She is alert and oriented to person, place, and time.     Deep Tendon Reflexes: Reflexes are normal and symmetric.  Psychiatric:        Behavior: Behavior normal.        Thought Content: Thought content normal.        Judgment: Judgment normal.     Results for orders placed or performed in visit on 01/05/19  CBC with Differential/Platelet  Result Value Ref Range   WBC 5.6 3.4 - 10.8 x10E3/uL   RBC 4.30 3.77 - 5.28 x10E6/uL   Hemoglobin 11.7 11.1 - 15.9 g/dL   Hematocrit 33.9 (L) 34.0 - 46.6 %   MCV 79 79 - 97 fL   MCH 27.2 26.6 - 33.0 pg   MCHC 34.5 31.5 - 35.7 g/dL   RDW 13.3 11.7 - 15.4 %   Platelets 369 150 - 450 x10E3/uL   Neutrophils 55 Not Estab. %   Lymphs 34 Not Estab. %   Monocytes 7 Not Estab. %   Eos 4 Not Estab. %   Basos 0 Not Estab. %   Neutrophils Absolute 3.0 1.4 - 7.0 x10E3/uL   Lymphocytes Absolute 1.9 0.7 - 3.1 x10E3/uL   Monocytes Absolute 0.4 0.1 - 0.9 x10E3/uL   EOS (ABSOLUTE) 0.2 0.0 - 0.4 x10E3/uL   Basophils Absolute 0.0 0.0 - 0.2 x10E3/uL   Immature Granulocytes 0 Not Estab. %   Immature Grans (Abs) 0.0 0.0 - 0.1 x10E3/uL  CMP14+EGFR  Result Value Ref Range   Glucose 86 65 - 99 mg/dL   BUN 15 8 - 27 mg/dL   Creatinine, Ser 0.76 0.57 - 1.00 mg/dL   GFR calc non Af Amer 82 >59 mL/min/1.73   GFR calc Af Amer 95 >59 mL/min/1.73   BUN/Creatinine Ratio 20 12 - 28   Sodium 140 134 - 144 mmol/L   Potassium 4.2 3.5 - 5.2 mmol/L   Chloride 101 96 - 106 mmol/L   CO2 24 20 - 29 mmol/L   Calcium 9.8 8.7 - 10.3 mg/dL   Total Protein 7.4 6.0 - 8.5 g/dL   Albumin 4.4 3.8 - 4.8 g/dL   Globulin, Total 3.0 1.5 -  4.5 g/dL   Albumin/Globulin Ratio 1.5 1.2 - 2.2  Bilirubin Total 0.2 0.0 - 1.2 mg/dL   Alkaline Phosphatase 91 39 - 117 IU/L   AST 15 0 - 40 IU/L   ALT 12 0 - 32 IU/L  Lipid panel  Result Value Ref Range   Cholesterol, Total 166 100 - 199 mg/dL   Triglycerides 84 0 - 149 mg/dL   HDL 42 >39 mg/dL   VLDL Cholesterol Cal 17 5 - 40 mg/dL   LDL Calculated 107 (H) 0 - 99 mg/dL   Chol/HDL Ratio 4.0 0.0 - 4.4 ratio  TSH  Result Value Ref Range   TSH 1.190 0.450 - 4.500 uIU/mL  Bayer DCA Hb A1c Waived  Result Value Ref Range   HB A1C (BAYER DCA - WAIVED) 6.2 <7.0 %      Assessment & Plan:   1. Essential hypertension - CBC with Differential/Platelet - CMP14+EGFR - Lipid panel - TSH - lisinopril-hydrochlorothiazide (PRINZIDE,ZESTORETIC) 10-12.5 MG tablet; Take 1 tablet by mouth daily.  Dispense: 90 tablet; Refill: 3  2. Finger pain, right - naproxen (NAPROSYN) 500 MG tablet; Take 1 tablet (500 mg total) by mouth 2 (two) times daily with a meal.  Dispense: 40 tablet; Refill: 3  3. Hyperlipidemia associated with type 2 diabetes mellitus (HCC) - Lipid panel - Bayer DCA Hb A1c Waived - pravastatin (PRAVACHOL) 20 MG tablet; Take 1 tablet (20 mg total) by mouth daily.  Dispense: 90 tablet; Refill: 3  4. Right arm pain - naproxen (NAPROSYN) 500 MG tablet; Take 1 tablet (500 mg total) by mouth 2 (two) times daily with a meal.  Dispense: 40 tablet; Refill: 3  5. Well adult exam - CBC with Differential/Platelet - CMP14+EGFR - Lipid panel - TSH - Bayer DCA Hb A1c Waived   Continue all other maintenance medications as listed above.  Follow up plan: Return in about 3 months (around 04/05/2019) for recheck.  Educational handout given for [place] PA-C No Name [address]  [place], [place] [address] [phone]   01/08/2019, 3:34 PM

## 2019-01-06 LAB — CBC WITH DIFFERENTIAL/PLATELET
Basophils Absolute: 0 10*3/uL (ref 0.0–0.2)
Basos: 0 %
EOS (ABSOLUTE): 0.2 10*3/uL (ref 0.0–0.4)
Eos: 4 %
Hematocrit: 33.9 % — ABNORMAL LOW (ref 34.0–46.6)
Hemoglobin: 11.7 g/dL (ref 11.1–15.9)
Immature Grans (Abs): 0 10*3/uL (ref 0.0–0.1)
Immature Granulocytes: 0 %
Lymphocytes Absolute: 1.9 10*3/uL (ref 0.7–3.1)
Lymphs: 34 %
MCH: 27.2 pg (ref 26.6–33.0)
MCHC: 34.5 g/dL (ref 31.5–35.7)
MCV: 79 fL (ref 79–97)
Monocytes Absolute: 0.4 10*3/uL (ref 0.1–0.9)
Monocytes: 7 %
Neutrophils Absolute: 3 10*3/uL (ref 1.4–7.0)
Neutrophils: 55 %
Platelets: 369 10*3/uL (ref 150–450)
RBC: 4.3 x10E6/uL (ref 3.77–5.28)
RDW: 13.3 % (ref 11.7–15.4)
WBC: 5.6 10*3/uL (ref 3.4–10.8)

## 2019-01-06 LAB — LIPID PANEL
CHOLESTEROL TOTAL: 166 mg/dL (ref 100–199)
Chol/HDL Ratio: 4 ratio (ref 0.0–4.4)
HDL: 42 mg/dL (ref >39)
LDL CALC: 107 mg/dL — AB (ref 0–99)
Triglycerides: 84 mg/dL (ref 0–149)
VLDL Cholesterol Cal: 17 mg/dL (ref 5–40)

## 2019-01-06 LAB — CMP14+EGFR
ALT: 12 IU/L (ref 0–32)
AST: 15 IU/L (ref 0–40)
Albumin/Globulin Ratio: 1.5 (ref 1.2–2.2)
Albumin: 4.4 g/dL (ref 3.8–4.8)
Alkaline Phosphatase: 91 IU/L (ref 39–117)
BUN/Creatinine Ratio: 20 (ref 12–28)
BUN: 15 mg/dL (ref 8–27)
Bilirubin Total: 0.2 mg/dL (ref 0.0–1.2)
CHLORIDE: 101 mmol/L (ref 96–106)
CO2: 24 mmol/L (ref 20–29)
Calcium: 9.8 mg/dL (ref 8.7–10.3)
Creatinine, Ser: 0.76 mg/dL (ref 0.57–1.00)
GFR calc Af Amer: 95 mL/min/{1.73_m2} (ref >59)
GFR calc non Af Amer: 82 mL/min/{1.73_m2} (ref >59)
Globulin, Total: 3 g/dL (ref 1.5–4.5)
Glucose: 86 mg/dL (ref 65–99)
Potassium: 4.2 mmol/L (ref 3.5–5.2)
Sodium: 140 mmol/L (ref 134–144)
Total Protein: 7.4 g/dL (ref 6.0–8.5)

## 2019-01-06 LAB — TSH: TSH: 1.19 u[IU]/mL (ref 0.450–4.500)

## 2019-01-09 ENCOUNTER — Other Ambulatory Visit: Payer: Self-pay | Admitting: *Deleted

## 2019-01-09 MED ORDER — METFORMIN HCL 500 MG PO TABS: 500.0000 mg | ORAL_TABLET | Freq: Every day | ORAL | 2 refills | Status: DC

## 2019-03-13 ENCOUNTER — Other Ambulatory Visit: Payer: Self-pay

## 2019-03-14 ENCOUNTER — Ambulatory Visit (INDEPENDENT_AMBULATORY_CARE_PROVIDER_SITE_OTHER): Payer: Medicare HMO | Admitting: Physician Assistant

## 2019-03-14 ENCOUNTER — Encounter: Payer: Self-pay | Admitting: Physician Assistant

## 2019-03-14 VITALS — BP 108/61 | HR 69 | Temp 97.4°F | Ht 63.0 in | Wt 180.8 lb

## 2019-03-14 DIAGNOSIS — L900 Lichen sclerosus et atrophicus: Secondary | ICD-10-CM | POA: Diagnosis not present

## 2019-03-14 MED ORDER — CLOBETASOL PROP EMOLLIENT BASE 0.05 % EX CREA: 1.0000 "application " | TOPICAL_CREAM | Freq: Every day | CUTANEOUS | 1 refills | Status: DC

## 2019-03-14 MED ORDER — METFORMIN HCL 500 MG PO TABS: 500.0000 mg | ORAL_TABLET | Freq: Every day | ORAL | 2 refills | Status: DC

## 2019-03-14 NOTE — Patient Instructions (Signed)
Lichen Sclerosus  Lichen sclerosus is a skin problem. It can happen on any part of the body. It happens most often in the anal or genital areas. It can cause itching and discomfort. Treatment can help to control symptoms. It can also help prevent scarring that may lead to other problems.  What are the causes?  The cause of this condition is not known. It is not passed from one person to another (not contagious).  What increases the risk?  This condition is more likely to develop in women. It most often occurs after menopause.  What are the signs or symptoms?  Symptoms of this condition include:  Thin, wrinkled, white areas on the skin.  Thickened white areas on the skin.  Red and swollen patches (lesions) on the skin.  Tears or cracks in the skin.  Bruising.  Blood blisters.  Very bad itching.  Pain, itching, or burning when peeing (urinating).  Trouble pooping (constipation).  How is this diagnosed?  This condition may be diagnosed with a physical exam. A sample of your skin may also be removed to be looked at under a microscope (biopsy).  How is this treated?  This condition may be treated with:  Creams or ointments (topical steroids) that are put on the skin in the affected areas. This is the most common treatment.  Medicines that are taken by mouth.  Surgery. This is only needed if the condition is very bad and is causing problems such as scarring.  Follow these instructions at home:  Take or use over-the-counter and prescription medicines only as told by your doctor.  Use creams or ointments as told by your doctor.  Do not scratch the affected areas of skin.  If you are a woman, keep the vagina as clean and dry as you can.  Clean the affected area of skin gently with water. Avoid using rough towels or toilet paper.  Keep all follow-up visits as told by your doctor. This is important.  Contact a doctor if:  Your redness, swelling, or pain gets worse.  You have fluid, blood, or pus coming from the area.  You have  new red and swollen patches on your skin.  You have a fever.  You have pain during sex.  Summary  Lichen sclerosus is a skin problem. It can cause itching and discomfort.  This condition is usually treated with creams or ointments that are put on the skin in the affected areas.  Use medicines only as told by your doctor.  Do not scratch the affected areas of skin.  Keep all follow-up visits as told by your doctor. This is important.  This information is not intended to replace advice given to you by your health care provider. Make sure you discuss any questions you have with your health care provider.  Document Released: 10/08/2008 Document Revised: 03/10/2018 Document Reviewed: 03/10/2018  Elsevier Interactive Patient Education  2019 [employer]

## 2019-03-14 NOTE — Progress Notes (Signed)
BP 108/61   Pulse 69   Temp (!) 97.4 F (36.3 C) (Oral)   Ht 5\' 3"  (1.6 m)   Wt 180 lb 12.8 oz (82 kg)   BMI 32.03 kg/m    Subjective:    Patient ID: Debra Patton, female    DOB: [birth year], 68 y.o.   MRN: [id number]  HPI: Debra Patton is a 68 y.o. female presenting on 03/14/2019 for vaginal burning  In the past few weeks the patient has had increased vaginal irritation and in the perineum.  She states there has been extensive itching and she has found herself scratching it a lot.  She stopped having a cycle about 15 years ago.  She is not sexually active at this time, she was widowed this year.  She does not know of any abnormal Pap results that she is ever had.  Past Medical History:  Diagnosis Date  . Allergy   . Diabetes mellitus without complication (HCC)   . History of acute pancreatitis   . History of herpes zoster   . Hyperlipidemia   . Hypertension    Relevant past medical, surgical, family and social history reviewed and updated as indicated. Interim medical history since our last visit reviewed. Allergies and medications reviewed and updated. DATA REVIEWED: CHART IN EPIC  Family History reviewed for pertinent findings.  Review of Systems  Constitutional: Negative.   HENT: Negative.   Eyes: Negative.   Respiratory: Negative.   Gastrointestinal: Negative.   Genitourinary: Positive for dysuria, genital sores and vaginal pain. Negative for pelvic pain, urgency, vaginal bleeding and vaginal discharge.    Allergies as of 03/14/2019      Reactions   Ivp Dye [iodinated Diagnostic Agents]       Medication List       Accurate as of Mar 14, 2019 12:27 PM. Always use your most recent med list.        acetaminophen 500 MG tablet Commonly known as:  TYLENOL Take 500 mg by mouth every 6 (six) hours as needed.   aspirin-sod bicarb-citric acid 325 MG Tbef tablet Commonly known as:  ALKA-SELTZER Take 325 mg by mouth every 6 (six) hours as needed.    Clobetasol Prop Emollient Base 0.05 % emollient cream Commonly known as:  Clobetasol Propionate E Apply 1 application topically daily.   diphenhydramine-acetaminophen 25-500 MG Tabs tablet Commonly known as:  TYLENOL PM Take 1 tablet by mouth at bedtime as needed.   latanoprost 0.005 % ophthalmic solution Commonly known as:  XALATAN TAKE 1 DROP(S) IN BOTH EYES ONCE IN THE EVENING   lisinopril-hydrochlorothiazide 10-12.5 MG tablet Commonly known as:  ZESTORETIC Take 1 tablet by mouth daily.   metFORMIN 500 MG tablet Commonly known as:  GLUCOPHAGE Take 1 tablet (500 mg total) by mouth daily with breakfast.   naproxen 500 MG tablet Commonly known as:  Naprosyn Take 1 tablet (500 mg total) by mouth 2 (two) times daily with a meal.   pravastatin 20 MG tablet Commonly known as:  PRAVACHOL Take 1 tablet (20 mg total) by mouth daily.          Objective:    BP 108/61   Pulse 69   Temp (!) 97.4 F (36.3 C) (Oral)   Ht 5\' 3"  (1.6 m)   Wt 180 lb 12.8 oz (82 kg)   BMI 32.03 kg/m   Allergies  Allergen Reactions  . Ivp Dye [Iodinated Diagnostic Agents]     Wt Readings from Last 3  Encounters:  03/14/19 180 lb 12.8 oz (82 kg)  01/05/19 190 lb 3.2 oz (86.3 kg)  07/29/18 191 lb 9.6 oz (86.9 kg)    Physical Exam Constitutional:      Appearance: She is well-developed.  HENT:     Head: Normocephalic and atraumatic.  Eyes:     Conjunctiva/sclera: Conjunctivae normal.     Pupils: Pupils are equal, round, and reactive to light.  Cardiovascular:     Rate and Rhythm: Normal rate and regular rhythm.     Heart sounds: Normal heart sounds.  Pulmonary:     Effort: Pulmonary effort is normal.     Breath sounds: Normal breath sounds.  Abdominal:     General: Bowel sounds are normal.     Palpations: Abdomen is soft.  Genitourinary:    Labia:        Right: Lesion present. No rash or tenderness.        Left: Lesion present. No rash or tenderness.        Comments: The entire  labia minora is white and without any pigment.  A minimal amount of the inside area of the labia majora is white.  There is thin white tissue in the perineum and vaginal opening Skin:    General: Skin is warm and dry.     Findings: No rash.  Neurological:     Mental Status: She is alert and oriented to person, place, and time.     Deep Tendon Reflexes: Reflexes are normal and symmetric.  Psychiatric:        Behavior: Behavior normal.        Thought Content: Thought content normal.        Judgment: Judgment normal.         Assessment & Plan:   1. Lichen sclerosus - Clobetasol Prop Emollient Base (CLOBETASOL PROPIONATE E) 0.05 % emollient cream; Apply 1 application topically daily.  Dispense: 60 g; Refill: 1   Continue all other maintenance medications as listed above.  Follow up plan: Return in about 4 weeks (around 04/11/2019) for 4-6 weeks.  Educational handout given for lichen sclerosus education  Remus LofflerAngel S. Keelyn Monjaras PA-C [facility] [address]  [place], [place] [address] [phone]   03/14/2019, 12:27 PM

## 2019-04-04 ENCOUNTER — Other Ambulatory Visit: Payer: Self-pay

## 2019-04-05 ENCOUNTER — Other Ambulatory Visit: Payer: Self-pay

## 2019-04-05 ENCOUNTER — Ambulatory Visit (INDEPENDENT_AMBULATORY_CARE_PROVIDER_SITE_OTHER): Payer: Medicare HMO | Admitting: Physician Assistant

## 2019-04-05 ENCOUNTER — Encounter: Payer: Self-pay | Admitting: Physician Assistant

## 2019-04-05 VITALS — BP 129/63 | HR 79 | Temp 97.2°F | Ht 63.0 in | Wt 178.8 lb

## 2019-04-05 DIAGNOSIS — M79644 Pain in right finger(s): Secondary | ICD-10-CM

## 2019-04-05 DIAGNOSIS — M79601 Pain in right arm: Secondary | ICD-10-CM | POA: Diagnosis not present

## 2019-04-05 DIAGNOSIS — E119 Type 2 diabetes mellitus without complications: Secondary | ICD-10-CM

## 2019-04-05 DIAGNOSIS — I10 Essential (primary) hypertension: Secondary | ICD-10-CM | POA: Diagnosis not present

## 2019-04-05 DIAGNOSIS — L900 Lichen sclerosus et atrophicus: Secondary | ICD-10-CM

## 2019-04-05 MED ORDER — METFORMIN HCL 500 MG PO TABS: 500.0000 mg | ORAL_TABLET | Freq: Every day | ORAL | 5 refills | Status: DC

## 2019-04-05 MED ORDER — NAPROXEN 500 MG PO TABS
500.0000 mg | ORAL_TABLET | Freq: Two times a day (BID) | ORAL | 5 refills | Status: DC
Start: 2019-04-05 — End: 2020-04-05

## 2019-04-05 NOTE — Progress Notes (Signed)
BP 129/63   Pulse 79   Temp (!) 97.2 F (36.2 C) (Oral)   Ht 5' 3"  (1.6 m)   Wt 178 lb 12.8 oz (81.1 kg)   BMI 31.67 kg/m    Subjective:    Patient ID: Debra Patton, female    DOB: [birth year], 68 y.o.   MRN: [id number]  HPI: Debra Patton is a 68 y.o. female presenting on 04/05/2019 for Diabetes and Hyperlipidemia  Hypertension: She has had very good and control blood pressure.  She is tolerating her medications pill she is not having any difficulty, no shortness of breath, no chest pain.  Diabetes: Well controlled, her A1c was very good.  She does a very good job with diet and exercise.  She does not have any other complaints at this time.  She does need refills on her medications.  Lichen sclerosus: She has great improvement since the beginning of the month.  The area is not itching at all and she is starting to have change in the tissues back to normal.  Clobetasol was just very expensive.  She had just been $100 on it.  However she still has a significant amount.  When she finishes using this tube she will give [place] a call back and the next 3 we will try our halobetasol, fluocinonide, betamethasone.  That way we can find what is the best preferred on her insurance.  I was unable to find this in Epic   Past Medical History:  Diagnosis Date  . Allergy   . Diabetes mellitus without complication ([place])   . History of acute pancreatitis   . History of herpes zoster   . Hyperlipidemia   . Hypertension    Relevant past medical, surgical, family and social history reviewed and updated as indicated. Interim medical history since our last visit reviewed. Allergies and medications reviewed and updated. DATA REVIEWED: CHART IN EPIC  Family History reviewed for pertinent findings.  Review of Systems  Constitutional: Negative.   HENT: Negative.   Eyes: Negative.   Respiratory: Negative.   Gastrointestinal: Negative.   Genitourinary: Negative.   Musculoskeletal: Positive for  arthralgias and joint swelling.    Allergies as of 04/05/2019      Reactions   Ivp Dye [iodinated Diagnostic Agents]       Medication List       Accurate as of Apr 05, 2019 11:59 PM. If you have any questions, ask your nurse or doctor.        acetaminophen 500 MG tablet Commonly known as:  TYLENOL Take 500 mg by mouth every 6 (six) hours as needed.   aspirin-sod bicarb-citric acid 325 MG Tbef tablet Commonly known as:  ALKA-SELTZER Take 325 mg by mouth every 6 (six) hours as needed.   Clobetasol Prop Emollient Base 0.05 % emollient cream Commonly known as:  Clobetasol Propionate E Apply 1 application topically daily.   diphenhydramine-acetaminophen 25-500 MG Tabs tablet Commonly known as:  TYLENOL PM Take 1 tablet by mouth at bedtime as needed.   latanoprost 0.005 % ophthalmic solution Commonly known as:  XALATAN TAKE 1 DROP(S) IN BOTH EYES ONCE IN THE EVENING   lisinopril-hydrochlorothiazide 10-12.5 MG tablet Commonly known as:  ZESTORETIC Take 1 tablet by mouth daily.   metFORMIN 500 MG tablet Commonly known as:  GLUCOPHAGE Take 1 tablet (500 mg total) by mouth daily with breakfast.   naproxen 500 MG tablet Commonly known as:  Naprosyn Take 1 tablet (500 mg total) by  mouth 2 (two) times daily with a meal.   pravastatin 20 MG tablet Commonly known as:  PRAVACHOL Take 1 tablet (20 mg total) by mouth daily.          Objective:    BP 129/63   Pulse 79   Temp (!) 97.2 F (36.2 C) (Oral)   Ht 5' 3"  (1.6 m)   Wt 178 lb 12.8 oz (81.1 kg)   BMI 31.67 kg/m   Allergies  Allergen Reactions  . Ivp Dye [Iodinated Diagnostic Agents]     Wt Readings from Last 3 Encounters:  04/05/19 178 lb 12.8 oz (81.1 kg)  03/14/19 180 lb 12.8 oz (82 kg)  01/05/19 190 lb 3.2 oz (86.3 kg)    Physical Exam Constitutional:      Appearance: She is well-developed.  HENT:     Head: Normocephalic and atraumatic.  Eyes:     Conjunctiva/sclera: Conjunctivae normal.      Pupils: Pupils are equal, round, and reactive to light.  Cardiovascular:     Rate and Rhythm: Normal rate and regular rhythm.     Heart sounds: Normal heart sounds.  Pulmonary:     Effort: Pulmonary effort is normal.     Breath sounds: Normal breath sounds.  Skin:    General: Skin is warm and dry.     Findings: No rash.  Neurological:     Mental Status: She is alert and oriented to person, place, and time.     Deep Tendon Reflexes: Reflexes are normal and symmetric.     Results for orders placed or performed in visit on 01/05/19  CBC with Differential/Platelet  Result Value Ref Range   WBC 5.6 3.4 - 10.8 x10E3/uL   RBC 4.30 3.77 - 5.28 x10E6/uL   Hemoglobin 11.7 11.1 - 15.9 g/dL   Hematocrit 33.9 (L) 34.0 - 46.6 %   MCV 79 79 - 97 fL   MCH 27.2 26.6 - 33.0 pg   MCHC 34.5 31.5 - 35.7 g/dL   RDW 13.3 11.7 - 15.4 %   Platelets 369 150 - 450 x10E3/uL   Neutrophils 55 Not Estab. %   Lymphs 34 Not Estab. %   Monocytes 7 Not Estab. %   Eos 4 Not Estab. %   Basos 0 Not Estab. %   Neutrophils Absolute 3.0 1.4 - 7.0 x10E3/uL   Lymphocytes Absolute 1.9 0.7 - 3.1 x10E3/uL   Monocytes Absolute 0.4 0.1 - 0.9 x10E3/uL   EOS (ABSOLUTE) 0.2 0.0 - 0.4 x10E3/uL   Basophils Absolute 0.0 0.0 - 0.2 x10E3/uL   Immature Granulocytes 0 Not Estab. %   Immature Grans (Abs) 0.0 0.0 - 0.1 x10E3/uL  CMP14+EGFR  Result Value Ref Range   Glucose 86 65 - 99 mg/dL   BUN 15 8 - 27 mg/dL   Creatinine, Ser 0.76 0.57 - 1.00 mg/dL   GFR calc non Af Amer 82 >59 mL/min/1.73   GFR calc Af Amer 95 >59 mL/min/1.73   BUN/Creatinine Ratio 20 12 - 28   Sodium 140 134 - 144 mmol/L   Potassium 4.2 3.5 - 5.2 mmol/L   Chloride 101 96 - 106 mmol/L   CO2 24 20 - 29 mmol/L   Calcium 9.8 8.7 - 10.3 mg/dL   Total Protein 7.4 6.0 - 8.5 g/dL   Albumin 4.4 3.8 - 4.8 g/dL   Globulin, Total 3.0 1.5 - 4.5 g/dL   Albumin/Globulin Ratio 1.5 1.2 - 2.2   Bilirubin Total 0.2 0.0 - 1.2 mg/dL  Alkaline Phosphatase 91 39 - 117  IU/L   AST 15 0 - 40 IU/L   ALT 12 0 - 32 IU/L  Lipid panel  Result Value Ref Range   Cholesterol, Total 166 100 - 199 mg/dL   Triglycerides 84 0 - 149 mg/dL   HDL 42 >39 mg/dL   VLDL Cholesterol Cal 17 5 - 40 mg/dL   LDL Calculated 107 (H) 0 - 99 mg/dL   Chol/HDL Ratio 4.0 0.0 - 4.4 ratio  TSH  Result Value Ref Range   TSH 1.190 0.450 - 4.500 uIU/mL  Bayer DCA Hb A1c Waived  Result Value Ref Range   HB A1C (BAYER DCA - WAIVED) 6.2 <7.0 %      Assessment & Plan:   1. Essential hypertension Continue lisinopril hydrochlorothiazide  2. Finger pain, right - naproxen (NAPROSYN) 500 MG tablet; Take 1 tablet (500 mg total) by mouth 2 (two) times daily with a meal.  Dispense: 60 tablet; Refill: 5  3. Right arm pain - naproxen (NAPROSYN) 500 MG tablet; Take 1 tablet (500 mg total) by mouth 2 (two) times daily with a meal.  Dispense: 60 tablet; Refill: 5  4. Lichen sclerosus Continue clobetasol, when she runs out of this to be the next prescriptions to try or halobetasol, fluocinonide, betamethasone.  The clobetasol was cost prohibitive.  5. Diabetes mellitus without complication (HCC) - metFORMIN (GLUCOPHAGE) 500 MG tablet; Take 1 tablet (500 mg total) by mouth daily with breakfast.  Dispense: 30 tablet; Refill: 5   Continue all other maintenance medications as listed above.  Follow up plan: Return in about 3 months (around 07/06/2019) for recheck.  Educational handout given for Banks PA-C [place] [address]  [place], [place] [address] [phone]   04/06/2019, 9:10 PM

## 2019-04-06 DIAGNOSIS — L900 Lichen sclerosus et atrophicus: Secondary | ICD-10-CM | POA: Insufficient documentation

## 2019-04-06 DIAGNOSIS — L280 Lichen simplex chronicus: Secondary | ICD-10-CM | POA: Insufficient documentation

## 2019-04-25 ENCOUNTER — Ambulatory Visit: Payer: Medicare HMO | Admitting: Physician Assistant

## 2019-05-01 ENCOUNTER — Ambulatory Visit (INDEPENDENT_AMBULATORY_CARE_PROVIDER_SITE_OTHER): Payer: Medicare HMO | Admitting: *Deleted

## 2019-05-01 ENCOUNTER — Encounter: Payer: Self-pay | Admitting: *Deleted

## 2019-05-01 ENCOUNTER — Other Ambulatory Visit: Payer: Self-pay

## 2019-05-01 DIAGNOSIS — Z0000 Encounter for general adult medical examination without abnormal findings: Secondary | ICD-10-CM | POA: Diagnosis not present

## 2019-05-01 NOTE — Patient Instructions (Signed)
Preventive Care 67 Years and Older, Female Preventive care refers to lifestyle choices and visits with your health care provider that can promote health and wellness. What does preventive care include?  A yearly physical exam. This is also called an annual well check.  Dental exams once or twice a year.  Routine eye exams. Ask your health care provider how often you should have your eyes checked.  Personal lifestyle choices, including: ? Daily care of your teeth and gums. ? Regular physical activity. ? Eating a healthy diet. ? Avoiding tobacco and drug use. ? Limiting alcohol use. ? Practicing safe sex. ? Taking low-dose aspirin every day. ? Taking vitamin and mineral supplements as recommended by your health care provider. What happens during an annual well check? The services and screenings done by your health care provider during your annual well check will depend on your age, overall health, lifestyle risk factors, and family history of disease. Counseling Your health care provider may ask you questions about your:  Alcohol use.  Tobacco use.  Drug use.  Emotional well-being.  Home and relationship well-being.  Sexual activity.  Eating habits.  History of falls.  Memory and ability to understand (cognition).  Work and work Statistician.  Reproductive health.  Screening You may have the following tests or measurements:  Height, weight, and BMI.  Blood pressure.  Lipid and cholesterol levels. These may be checked every 5 years, or more frequently if you are over 30 years old.  Skin check.  Lung cancer screening. You may have this screening every year starting at age 67 if you have a 30-pack-year history of smoking and currently smoke or have quit within the past 15 years.  Colorectal cancer screening. All adults should have this screening starting at age 67 and continuing until age 46. You will have tests every 1-10 years, depending on your results and the  type of screening test. People at increased risk should start screening at an earlier age. Screening tests may include: ? Guaiac-based fecal occult blood testing. ? Fecal immunochemical test (FIT). ? Stool DNA test. ? Virtual colonoscopy. ? Sigmoidoscopy. During this test, a flexible tube with a tiny camera (sigmoidoscope) is used to examine your rectum and lower colon. The sigmoidoscope is inserted through your anus into your rectum and lower colon. ? Colonoscopy. During this test, a long, thin, flexible tube with a tiny camera (colonoscope) is used to examine your entire colon and rectum.  Hepatitis C blood test.  Hepatitis B blood test.  Sexually transmitted disease (STD) testing.  Diabetes screening. This is done by checking your blood sugar (glucose) after you have not eaten for a while (fasting). You may have this done every 1-3 years.  Bone density scan. This is done to screen for osteoporosis. You may have this done starting at age 67.  Mammogram. This may be done every 1-2 years. Talk to your health care provider about how often you should have regular mammograms. Talk with your health care provider about your test results, treatment options, and if necessary, the need for more tests. Vaccines Your health care provider may recommend certain vaccines, such as:  Influenza vaccine. This is recommended every year.  Tetanus, diphtheria, and acellular pertussis (Tdap, Td) vaccine. You may need a Td booster every 10 years.  Varicella vaccine. You may need this if you have not been vaccinated.  Zoster vaccine. You may need this after age 67.  Measles, mumps, and rubella (MMR) vaccine. You may need at least  one dose of MMR if you were born in 1957 or later. You may also need a second dose.  Pneumococcal 13-valent conjugate (PCV13) vaccine. One dose is recommended after age 24.  Pneumococcal polysaccharide (PPSV23) vaccine. One dose is recommended after age 24.  Meningococcal  vaccine. You may need this if you have certain conditions.  Hepatitis A vaccine. You may need this if you have certain conditions or if you travel or work in places where you may be exposed to hepatitis A.  Hepatitis B vaccine. You may need this if you have certain conditions or if you travel or work in places where you may be exposed to hepatitis B.  Haemophilus influenzae type b (Hib) vaccine. You may need this if you have certain conditions. Talk to your health care provider about which screenings and vaccines you need and how often you need them. This information is not intended to replace advice given to you by your health care provider. Make sure you discuss any questions you have with your health care provider. Document Released: 11/22/2015 Document Revised: 12/16/2017 Document Reviewed: 08/27/2015 Elsevier Interactive Patient Education  2019 [employer].

## 2019-05-01 NOTE — Progress Notes (Signed)
MEDICARE ANNUAL WELLNESS VISIT  05/01/2019  Telephone Visit Disclaimer This Medicare AWV was conducted by telephone due to national recommendations for restrictions regarding the COVID-19 Pandemic (e.g. social distancing).  I verified, using two identifiers, that I am speaking with Debra Primroseoberta B Velez or their authorized healthcare agent. I discussed the limitations, risks, security, and privacy concerns of performing an evaluation and management service by telephone and the potential availability of an in-person appointment in the future. The patient expressed understanding and agreed to proceed.   Subjective:  Debra Patton is a 68 y.o. female patient of Remus LofflerJones, Angel S, PA-C who had a Medicare Annual Wellness Visit today via telephone. Debra Patton is Retired but still works part time and lives with their family. she has 2 children. she reports that she is socially active and does interact with friends/family regularly. she is minimally physically active and enjoys doing soduko puzzles and crossword puzzles.  Patient Care Team: Caryl NeverJones, Angel S, PA-C as PCP - General (Physician Assistant)  Advanced Directives 05/01/2019 04/28/2018  Does Patient Have a Medical Advance Directive? No No  Would patient like information on creating a medical advance directive? No - Patient declined Yes (MAU/Ambulatory/Procedural Areas - Information given)    Hospital Utilization Over the Past 12 Months: # of hospitalizations or ER visits: 0 # of surgeries: 0  Review of Systems    Patient reports that her overall health is unchanged compared to last year.  Patient Reported Readings (BP, Pulse, CBG, Weight, etc) none  Review of Systems: No complaints  All other systems negative.  Pain Assessment Pain : No/denies pain     Current Medications & Allergies (verified) Allergies as of 05/01/2019      Reactions   Ivp Dye [iodinated Diagnostic Agents]       Medication List       Accurate as of May 01, 2019  3:30 PM. If you have any questions, ask your nurse or doctor.        acetaminophen 500 MG tablet Commonly known as: TYLENOL Take 500 mg by mouth every 6 (six) hours as needed.   aspirin-sod bicarb-citric acid 325 MG Tbef tablet Commonly known as: ALKA-SELTZER Take 325 mg by mouth every 6 (six) hours as needed.   Clobetasol Prop Emollient Base 0.05 % emollient cream Commonly known as: Clobetasol Propionate E Apply 1 application topically daily.   diphenhydramine-acetaminophen 25-500 MG Tabs tablet Commonly known as: TYLENOL PM Take 1 tablet by mouth at bedtime as needed.   latanoprost 0.005 % ophthalmic solution Commonly known as: XALATAN TAKE 1 DROP(S) IN BOTH EYES ONCE IN THE EVENING   lisinopril-hydrochlorothiazide 10-12.5 MG tablet Commonly known as: ZESTORETIC Take 1 tablet by mouth daily.   metFORMIN 500 MG tablet Commonly known as: GLUCOPHAGE Take 1 tablet (500 mg total) by mouth daily with breakfast.   naproxen 500 MG tablet Commonly known as: Naprosyn Take 1 tablet (500 mg total) by mouth 2 (two) times daily with a meal.   pravastatin 20 MG tablet Commonly known as: PRAVACHOL Take 1 tablet (20 mg total) by mouth daily.       History (reviewed): Past Medical History:  Diagnosis Date  . Allergy   . Diabetes mellitus without complication (HCC)   . History of acute pancreatitis   . History of herpes zoster   . Hyperlipidemia   . Hypertension    Past Surgical History:  Procedure Laterality Date  . CARPAL TUNNEL RELEASE    . CHOLECYSTECTOMY    .  NECK SURGERY    . TUBAL LIGATION    . TUMOR REMOVAL Left 2016   Neck    Family History  Problem Relation Age of Onset  . Hypertension Mother   . Diabetes Mother   . Stroke Mother   . Vision loss Mother   . Asthma Mother   . Alcohol abuse Father   . Cancer Father   . Stroke Father   . Hypertension Sister   . Early death Brother   . Cancer Brother        Brain tumor   . Diabetes Sister   .  Hypertension Sister   . Cancer Brother   . Early death Brother   . Diabetes Brother   . Hypertension Brother   . Hypertension Brother   . Cancer Brother   . Diabetes Brother   . Hypertension Brother   . Cancer Brother   . Diabetes Brother   . Hypertension Brother   . Vision loss Brother   . Stroke Brother    Social History   Socioeconomic History  . Marital status: Single    Spouse name: Not on file  . Number of children: 2  . Years of education: GED  . Highest education level: GED or equivalent  Occupational History  . 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 Financial resource strain: Not hard at all  . Food insecurity    Worry: Never true    Inability: Never true  . Transportation needs    Medical: No    Non-medical: No  Tobacco Use  . Smoking status: Former Smoker    Quit date: 11/09/2004    Years since quitting: 14.4  . Smokeless tobacco: Never Used  Substance and Sexual Activity  . Alcohol use: Not Currently    Frequency: Never  . Drug use: Never  . Sexual activity: Not Currently  Lifestyle  . Physical activity    Days per week: 0 days    Minutes per session: 0 min  . Stress: Not at all  Relationships  . Social connections    Talks on phone: More than three times a week    Gets together: More than three times a week    Attends religious service: More than 4 times per year    Active member of club or organization: Yes    Attends meetings of clubs or organizations: More than 4 times per year    Relationship status: Married  Other Topics Concern  . Not on file  Social History Narrative  . Not on file    Activities of Daily Living In your present state of health, do you have any difficulty performing the following activities: 05/01/2019  Hearing? N  Vision? N  Difficulty concentrating or making decisions? N  Walking or climbing stairs? N  Dressing or bathing? N  Doing errands, shopping? N  Preparing Food and  eating ? N  Using the Toilet? N  In the past six months, have you accidently leaked urine? N  Do you have problems with loss of bowel control? N  Managing your Medications? N  Managing your Finances? N  Housekeeping or managing your Housekeeping? N  Some recent data might be hidden    Patient Literacy How often do you need to have someone help you when you read instructions, pamphlets, or other written materials from your doctor or pharmacy?: 1 - Never What is the last grade level you completed in  school?: GED  Exercise Current Exercise Habits: The patient does not participate in regular exercise at present, Exercise limited by: None identified  Diet Patient reports consuming 1 meals a day and 2 snack(s) a day Patient reports that her primary diet is: Regular Patient reports that she does have regular access to food.   Depression Screen PHQ 2/9 Scores 05/01/2019 04/05/2019 01/05/2019 07/29/2018 04/28/2018 03/25/2018 02/23/2018  PHQ - 2 Score 0 0 - 0 0 0 0  Exception Documentation - - Other- indicate reason in comment box - - - -  Not completed - - Patients husband just passed away 12/23/2018 - - - -     Fall Risk Fall Risk  05/01/2019 04/05/2019 01/05/2019 07/29/2018 04/28/2018  Falls in the past year? 0 0 0 No No     Objective:  Debra Patton seemed alert and oriented and she participated appropriately during our telephone visit.  Blood Pressure Weight BMI  BP Readings from Last 3 Encounters:  04/05/19 129/63  03/14/19 108/61  01/05/19 118/62   Wt Readings from Last 3 Encounters:  04/05/19 178 lb 12.8 oz (81.1 kg)  03/14/19 180 lb 12.8 oz (82 kg)  01/05/19 190 lb 3.2 oz (86.3 kg)   BMI Readings from Last 1 Encounters:  04/05/19 31.67 kg/m    *Unable to obtain current vital signs, weight, and BMI due to telephone visit type  Hearing/Vision  . Debra Patton did not seem to have difficulty with hearing/understanding during the telephone conversation . Reports that she has not had  a formal eye exam by an eye care professional within the past year . Reports that she has not had a formal hearing evaluation within the past year *Unable to fully assess hearing and vision during telephone visit type  Cognitive Function: 6CIT Screen 05/01/2019  What Year? 0 points  What month? 0 points  What time? 0 points  Count back from 20 0 points  Months in reverse 0 points  Repeat phrase 2 points  Total Score 2   (Normal:0-7, Significant for Dysfunction: >8)  Normal Cognitive Function Screening: Yes   Immunization & Health Maintenance Record Immunization History  Administered Date(s) Administered  . Tdap 04/27/2017    Health Maintenance  Topic Date Due  . Hepatitis C Screening  [birth year]  . FOOT EXAM  10/31/1962  . OPHTHALMOLOGY EXAM  10/31/1962  . MAMMOGRAM  12/18/2016  . PNA vac Low Risk Adult (1 of 2 - PCV13) 10/31/2017  . INFLUENZA VACCINE  06/10/2019  . HEMOGLOBIN A1C  07/06/2019  . COLONOSCOPY  03/02/2024  . TETANUS/TDAP  04/28/2027  . DEXA SCAN  Completed       Assessment  This is a routine wellness examination for Debra Patton.  Health Maintenance: Due or Overdue Health Maintenance Due  Topic Date Due  . Hepatitis C Screening  [birth year]  . FOOT EXAM  10/31/1962  . OPHTHALMOLOGY EXAM  10/31/1962  . MAMMOGRAM  12/18/2016  . PNA vac Low Risk Adult (1 of 2 - PCV13) 10/31/2017    Debra Patton does not need a referral for Community Assistance: Care Management:   no Social Work:    no Prescription Assistance:  no Nutrition/Diabetes Education:  no   Plan:  Personalized Goals Goals Addressed            This Visit's Progress   . Increase physical activity        Personalized Health Maintenance & Screening Recommendations  Pneumococcal vaccine  Screening mammography Advanced directives: has  NO advanced directive - not interested in additional information Shingles vaccine  Lung Cancer Screening Recommended: no (Low Dose CT Chest  recommended if Age 42-80 years, 30 pack-year currently smoking OR have quit w/in past 15 years) Hepatitis C Screening recommended: yes HIV Screening recommended: no  Advanced Directives: Written information was not prepared per patient's request.  Referrals & Orders No orders of the defined types were placed in this encounter.   Follow-up Plan . Follow-up with Terald Sleeper, PA-C as planned . Schedule your screening mammogram . Consider getting Prevnar and Shingles vaccines at your next visit with your PCP   I have personally reviewed and noted the following in the patient's chart:   . Medical and social history . Use of alcohol, tobacco or illicit drugs  . Current medications and supplements . Functional ability and status . Nutritional status . Physical activity . Advanced directives . List of other physicians . Hospitalizations, surgeries, and ER visits in previous 12 months . Vitals . Screenings to include cognitive, depression, and falls . Referrals and appointments  In addition, I have reviewed and discussed with Marlana Salvage certain preventive protocols, quality metrics, and best practice recommendations. A written personalized care plan for preventive services as well as general preventive health recommendations is available and can be mailed to the patient at her request.      Marylin Crosby, LPN  5/52/0802

## 2019-05-23 DIAGNOSIS — Z01419 Encounter for gynecological examination (general) (routine) without abnormal findings: Secondary | ICD-10-CM | POA: Diagnosis not present

## 2019-05-23 DIAGNOSIS — Z6831 Body mass index (BMI) 31.0-31.9, adult: Secondary | ICD-10-CM | POA: Diagnosis not present

## 2019-05-27 ENCOUNTER — Other Ambulatory Visit: Payer: Self-pay | Admitting: Physician Assistant

## 2019-05-27 DIAGNOSIS — E1169 Type 2 diabetes mellitus with other specified complication: Secondary | ICD-10-CM

## 2019-05-27 DIAGNOSIS — E785 Hyperlipidemia, unspecified: Secondary | ICD-10-CM

## 2019-05-27 DIAGNOSIS — I10 Essential (primary) hypertension: Secondary | ICD-10-CM

## 2019-07-07 ENCOUNTER — Other Ambulatory Visit: Payer: Self-pay

## 2019-07-10 ENCOUNTER — Encounter: Payer: Self-pay | Admitting: Physician Assistant

## 2019-07-10 ENCOUNTER — Other Ambulatory Visit: Payer: Self-pay

## 2019-07-10 ENCOUNTER — Ambulatory Visit (INDEPENDENT_AMBULATORY_CARE_PROVIDER_SITE_OTHER): Payer: Medicare HMO | Admitting: Physician Assistant

## 2019-07-10 VITALS — BP 119/61 | HR 65 | Temp 97.1°F | Ht 63.0 in | Wt 174.0 lb

## 2019-07-10 DIAGNOSIS — E119 Type 2 diabetes mellitus without complications: Secondary | ICD-10-CM | POA: Diagnosis not present

## 2019-07-10 DIAGNOSIS — M79644 Pain in right finger(s): Secondary | ICD-10-CM | POA: Diagnosis not present

## 2019-07-10 DIAGNOSIS — L900 Lichen sclerosus et atrophicus: Secondary | ICD-10-CM

## 2019-07-10 DIAGNOSIS — M79601 Pain in right arm: Secondary | ICD-10-CM | POA: Diagnosis not present

## 2019-07-10 DIAGNOSIS — I10 Essential (primary) hypertension: Secondary | ICD-10-CM

## 2019-07-10 DIAGNOSIS — N952 Postmenopausal atrophic vaginitis: Secondary | ICD-10-CM

## 2019-07-10 LAB — BAYER DCA HB A1C WAIVED: HB A1C (BAYER DCA - WAIVED): 5.8 % (ref <7.0)

## 2019-07-10 MED ORDER — CLOBETASOL PROP EMOLLIENT BASE 0.05 % EX CREA: 1.0000 "application " | TOPICAL_CREAM | Freq: Every day | CUTANEOUS | 11 refills | Status: DC

## 2019-07-10 MED ORDER — METFORMIN HCL 500 MG PO TABS: 500.0000 mg | ORAL_TABLET | Freq: Every day | ORAL | 5 refills | Status: DC

## 2019-07-10 MED ORDER — ESTROGENS, CONJUGATED 0.625 MG/GM VA CREA: 1.0000 | TOPICAL_CREAM | Freq: Every day | VAGINAL | 12 refills | Status: DC

## 2019-07-17 NOTE — Progress Notes (Signed)
BP 119/61   Pulse 65   Temp (!) 97.1 F (36.2 C) (Temporal)   Ht 5\' 3"  (1.6 m)   Wt 174 lb (78.9 kg)   BMI 30.82 kg/m    Subjective:    Patient ID: Debra Patton, female    DOB: 1951/07/12, 68 y.o.   MRN: [id number]  HPI: DONYE DAUENHAUER is a 68 y.o. female presenting on 07/10/2019 for Hypertension (3 month), Diabetes, and Medical Management of Chronic Issues Patient comes in for recheck on her chronic medical conditions which do include diabetes that has been fairly well controlled.  Most recent labs showed excellent control.  She will continue her medications.  We will have her have her labs performed in a few months.  Her blood pressure is currently controlled.  Is not had any difficulty with her medications.  He states that the medication is helping her lichen sclerosus repair what very well.  She is not having any difficulty with the medication.  She has however become sexually active and because of her menopausal state she states he is very tender and sore in that area.  She has never taken estrogen ever in her menopausal treatment.  We will start with cream and see if this will give her some relief.  She will let us know if it does not have any improvement.   Past Medical History:  Diagnosis Date  . Allergy   . Diabetes mellitus without complication ([place])   . History of acute pancreatitis   . History of herpes zoster   . Hyperlipidemia   . Hypertension    Relevant past medical, surgical, family and social history reviewed and updated as indicated. Interim medical history since our last visit reviewed. Allergies and medications reviewed and updated. DATA REVIEWED: CHART IN EPIC  Family History reviewed for pertinent findings.  Review of Systems  Constitutional: Negative.  Negative for activity change, fatigue and fever.  HENT: Negative.   Eyes: Negative.   Respiratory: Negative.  Negative for cough.   Cardiovascular: Negative.  Negative for chest pain.   Gastrointestinal: Negative.  Negative for abdominal pain.  Endocrine: Negative.   Genitourinary: Positive for vaginal pain. Negative for dysuria.  Musculoskeletal: Negative.   Skin: Negative.   Neurological: Negative.     Allergies as of 07/10/2019      Reactions   Ivp Dye [iodinated Diagnostic Agents]       Medication List       Accurate as of July 10, 2019 11:59 PM. If you have any questions, ask your nurse or doctor.        acetaminophen 500 MG tablet Commonly known as: TYLENOL Take 500 mg by mouth every 6 (six) hours as needed.   aspirin-sod bicarb-citric acid 325 MG Tbef tablet Commonly known as: ALKA-SELTZER Take 325 mg by mouth every 6 (six) hours as needed.   Clobetasol Prop Emollient Base 0.05 % emollient cream Commonly known as: Clobetasol Propionate E Apply 1 application topically daily.   conjugated estrogens vaginal cream Commonly known as: PREMARIN Place 1 Applicatorful vaginally daily. After 2 weeks maintain on 3 times a week Started by: Terald Sleeper, PA-C   diphenhydramine-acetaminophen 25-500 MG Tabs tablet Commonly known as: TYLENOL PM Take 1 tablet by mouth at bedtime as needed.   latanoprost 0.005 % ophthalmic solution Commonly known as: XALATAN TAKE 1 DROP(S) IN BOTH EYES ONCE IN THE EVENING   lisinopril-hydrochlorothiazide 10-12.5 MG tablet Commonly known as: ZESTORETIC TAKE 1 TABLET BY MOUTH EVERY  DAY   metFORMIN 500 MG tablet Commonly known as: GLUCOPHAGE Take 1 tablet (500 mg total) by mouth daily with breakfast.   naproxen 500 MG tablet Commonly known as: Naprosyn Take 1 tablet (500 mg total) by mouth 2 (two) times daily with a meal.   pravastatin 20 MG tablet Commonly known as: PRAVACHOL TAKE 1 TABLET BY MOUTH EVERY DAY          Objective:    BP 119/61   Pulse 65   Temp (!) 97.1 F (36.2 C) (Temporal)   Ht 5\' 3"  (1.6 m)   Wt 174 lb (78.9 kg)   BMI 30.82 kg/m   Allergies  Allergen Reactions  . Ivp Dye [Iodinated  Diagnostic Agents]     Wt Readings from Last 3 Encounters:  07/10/19 174 lb (78.9 kg)  04/05/19 178 lb 12.8 oz (81.1 kg)  03/14/19 180 lb 12.8 oz (82 kg)    Physical Exam Constitutional:      General: She is not in acute distress.    Appearance: Normal appearance. She is well-developed.  HENT:     Head: Normocephalic and atraumatic.  Cardiovascular:     Rate and Rhythm: Normal rate.  Pulmonary:     Effort: Pulmonary effort is normal.  Skin:    General: Skin is warm and dry.     Findings: No rash.  Neurological:     Mental Status: She is alert and oriented to person, place, and time.     Deep Tendon Reflexes: Reflexes are normal and symmetric.     Results for orders placed or performed in visit on 07/10/19  Bayer DCA Hb A1c Waived  Result Value Ref Range   HB A1C (BAYER DCA - WAIVED) 5.8 <7.0 %      Assessment & Plan:   1. Lichen sclerosus - Clobetasol Prop Emollient Base (CLOBETASOL PROPIONATE E) 0.05 % emollient cream; Apply 1 application topically daily.  Dispense: 60 g; Refill: 11  2. Diabetes mellitus without complication (HCC) - metFORMIN (GLUCOPHAGE) 500 MG tablet; Take 1 tablet (500 mg total) by mouth daily with breakfast.  Dispense: 30 tablet; Refill: 5 - Bayer DCA Hb A1c Waived  3. Finger pain, right  4. Right arm pain  5. Essential hypertension  6. Postmenopausal atrophic vaginitis - conjugated estrogens (PREMARIN) vaginal cream; Place 1 Applicatorful vaginally daily. After 2 weeks maintain on 3 times a week  Dispense: 42.5 g; Refill: 12   Continue all other maintenance medications as listed above.  Follow up plan: Return in about 6 months (around 01/07/2020) for recheck and labs.  Educational handout given for atrophic vaginitis  Remus LofflerAngel S. Akilah Cureton PA-C [facility] [address]  [place], [place] [address] [phone]   07/17/2019, 7:39 PM

## 2019-08-04 ENCOUNTER — Other Ambulatory Visit: Payer: Self-pay | Admitting: Physician Assistant

## 2019-08-04 ENCOUNTER — Telehealth: Payer: Self-pay | Admitting: *Deleted

## 2019-08-04 MED ORDER — BETAMETHASONE DIPROPIONATE AUG 0.05 % EX CREA: TOPICAL_CREAM | Freq: Two times a day (BID) | CUTANEOUS | 2 refills | Status: DC

## 2019-08-04 NOTE — Telephone Encounter (Signed)
Sent another steroid cream

## 2019-08-04 NOTE — Telephone Encounter (Signed)
Patient states clobetasol is too expensive and would like a different medication sent to pharmacy

## 2019-08-04 NOTE — Telephone Encounter (Signed)
Patient aware.

## 2019-08-04 NOTE — Progress Notes (Signed)
betameth  

## 2019-08-08 ENCOUNTER — Telehealth: Payer: Self-pay

## 2019-08-08 ENCOUNTER — Other Ambulatory Visit: Payer: Self-pay | Admitting: Physician Assistant

## 2019-08-08 MED ORDER — CLOBETASOL PROP EMOLLIENT BASE 0.05 % EX CREA: 1.0000 "application " | TOPICAL_CREAM | Freq: Two times a day (BID) | CUTANEOUS | 0 refills | Status: DC

## 2019-08-08 NOTE — Telephone Encounter (Signed)
Medication is sent to the pharmacy.

## 2019-08-08 NOTE — Telephone Encounter (Signed)
Patient aware.

## 2019-08-08 NOTE — Telephone Encounter (Signed)
Received a fax from pharmacy that Diprolene-AF 0.05% was too expensive.  Patient is requesting clobetasol emollient 0.05% CRM- Please advise and send back to [facility].

## 2019-08-21 ENCOUNTER — Telehealth: Payer: Self-pay | Admitting: *Deleted

## 2019-08-21 NOTE — Telephone Encounter (Signed)
Fax from [facility] request for cheaper alternative for estrogens conjugated (USP) 0.625 mg/ml vaginal cream Please advise

## 2019-08-22 ENCOUNTER — Other Ambulatory Visit: Payer: Self-pay | Admitting: Physician Assistant

## 2019-08-22 MED ORDER — ESTRADIOL 0.1 MG/GM VA CREA: 1.0000 | TOPICAL_CREAM | Freq: Every day | VAGINAL | 12 refills | Status: DC

## 2019-08-22 NOTE — Telephone Encounter (Signed)
Generic estradiol cream is sent

## 2019-10-21 ENCOUNTER — Other Ambulatory Visit: Payer: Self-pay | Admitting: Physician Assistant

## 2020-01-09 ENCOUNTER — Ambulatory Visit: Payer: Medicare HMO | Admitting: Physician Assistant

## 2020-01-21 DIAGNOSIS — E1136 Type 2 diabetes mellitus with diabetic cataract: Secondary | ICD-10-CM | POA: Diagnosis not present

## 2020-01-21 DIAGNOSIS — E11630 Type 2 diabetes mellitus with periodontal disease: Secondary | ICD-10-CM | POA: Diagnosis not present

## 2020-01-21 DIAGNOSIS — E11620 Type 2 diabetes mellitus with diabetic dermatitis: Secondary | ICD-10-CM | POA: Diagnosis not present

## 2020-01-21 DIAGNOSIS — Z791 Long term (current) use of non-steroidal anti-inflammatories (NSAID): Secondary | ICD-10-CM | POA: Diagnosis not present

## 2020-01-21 DIAGNOSIS — G8929 Other chronic pain: Secondary | ICD-10-CM | POA: Diagnosis not present

## 2020-01-21 DIAGNOSIS — H409 Unspecified glaucoma: Secondary | ICD-10-CM | POA: Diagnosis not present

## 2020-01-21 DIAGNOSIS — M1990 Unspecified osteoarthritis, unspecified site: Secondary | ICD-10-CM | POA: Diagnosis not present

## 2020-01-21 DIAGNOSIS — H04129 Dry eye syndrome of unspecified lacrimal gland: Secondary | ICD-10-CM | POA: Diagnosis not present

## 2020-01-21 DIAGNOSIS — I10 Essential (primary) hypertension: Secondary | ICD-10-CM | POA: Diagnosis not present

## 2020-01-21 DIAGNOSIS — E785 Hyperlipidemia, unspecified: Secondary | ICD-10-CM | POA: Diagnosis not present

## 2020-01-22 ENCOUNTER — Telehealth: Payer: Self-pay | Admitting: Physician Assistant

## 2020-01-22 ENCOUNTER — Encounter: Payer: Self-pay | Admitting: Physician Assistant

## 2020-01-22 ENCOUNTER — Ambulatory Visit (INDEPENDENT_AMBULATORY_CARE_PROVIDER_SITE_OTHER): Payer: Medicare HMO | Admitting: Physician Assistant

## 2020-01-22 ENCOUNTER — Other Ambulatory Visit: Payer: Self-pay

## 2020-01-22 VITALS — BP 114/66 | HR 83 | Temp 98.7°F | Ht 63.0 in | Wt 180.8 lb

## 2020-01-22 DIAGNOSIS — E119 Type 2 diabetes mellitus without complications: Secondary | ICD-10-CM

## 2020-01-22 DIAGNOSIS — G5601 Carpal tunnel syndrome, right upper limb: Secondary | ICD-10-CM

## 2020-01-22 DIAGNOSIS — I10 Essential (primary) hypertension: Secondary | ICD-10-CM | POA: Diagnosis not present

## 2020-01-22 LAB — BAYER DCA HB A1C WAIVED: HB A1C (BAYER DCA - WAIVED): 6.1 % (ref <7.0)

## 2020-01-22 NOTE — Telephone Encounter (Signed)
Spoke with patient on phone

## 2020-01-22 NOTE — Telephone Encounter (Signed)
  REFERRAL REQUEST Telephone Note 01/22/2020  What type of referral do you need? For numbness in fingers and pain right arm and shoulder  Have you been seen at our office for this problem? NO(Advise that they may need an appointment with their PCP before a referral can be done)  Is there a particular doctor or location that you prefer? Wherever Debra Patton wants to send. Forgot to mention in her appt today  Patient notified that referrals can take up to a week or longer to process. If they haven't heard anything within a week they should call back and speak with the referral department.

## 2020-01-23 LAB — LIPID PANEL
Chol/HDL Ratio: 3.9 ratio (ref 0.0–4.4)
Cholesterol, Total: 159 mg/dL (ref 100–199)
HDL: 41 mg/dL (ref >39)
LDL Chol Calc (NIH): 99 mg/dL (ref 0–99)
Triglycerides: 104 mg/dL (ref 0–149)
VLDL Cholesterol Cal: 19 mg/dL (ref 5–40)

## 2020-01-23 LAB — CBC WITH DIFFERENTIAL/PLATELET
Basophils Absolute: 0 10*3/uL (ref 0.0–0.2)
Basos: 0 %
EOS (ABSOLUTE): 0.3 10*3/uL (ref 0.0–0.4)
Eos: 6 %
Hematocrit: 34 % (ref 34.0–46.6)
Hemoglobin: 11.4 g/dL (ref 11.1–15.9)
Immature Grans (Abs): 0 10*3/uL (ref 0.0–0.1)
Immature Granulocytes: 0 %
Lymphocytes Absolute: 0.8 10*3/uL (ref 0.7–3.1)
Lymphs: 17 %
MCH: 27.3 pg (ref 26.6–33.0)
MCHC: 33.5 g/dL (ref 31.5–35.7)
MCV: 82 fL (ref 79–97)
Monocytes Absolute: 0.7 10*3/uL (ref 0.1–0.9)
Monocytes: 15 %
Neutrophils Absolute: 2.8 10*3/uL (ref 1.4–7.0)
Neutrophils: 62 %
Platelets: 368 10*3/uL (ref 150–450)
RBC: 4.17 x10E6/uL (ref 3.77–5.28)
RDW: 12.9 % (ref 11.7–15.4)
WBC: 4.6 10*3/uL (ref 3.4–10.8)

## 2020-01-23 LAB — CMP14+EGFR
ALT: 14 IU/L (ref 0–32)
AST: 17 IU/L (ref 0–40)
Albumin/Globulin Ratio: 1.5 (ref 1.2–2.2)
Albumin: 4.1 g/dL (ref 3.8–4.8)
Alkaline Phosphatase: 96 IU/L (ref 39–117)
BUN/Creatinine Ratio: 23 (ref 12–28)
BUN: 23 mg/dL (ref 8–27)
Bilirubin Total: <0.2 mg/dL (ref 0.0–1.2)
CO2: 24 mmol/L (ref 20–29)
Calcium: 9.5 mg/dL (ref 8.7–10.3)
Chloride: 102 mmol/L (ref 96–106)
Creatinine, Ser: 0.99 mg/dL (ref 0.57–1.00)
GFR calc Af Amer: 68 mL/min/{1.73_m2} (ref >59)
GFR calc non Af Amer: 59 mL/min/{1.73_m2} — ABNORMAL LOW (ref >59)
Globulin, Total: 2.8 g/dL (ref 1.5–4.5)
Glucose: 94 mg/dL (ref 65–99)
Potassium: 4.4 mmol/L (ref 3.5–5.2)
Sodium: 141 mmol/L (ref 134–144)
Total Protein: 6.9 g/dL (ref 6.0–8.5)

## 2020-01-23 LAB — TSH: TSH: 1.27 u[IU]/mL (ref 0.450–4.500)

## 2020-01-23 NOTE — Progress Notes (Signed)
BP 114/66   Pulse 83   Temp 98.7 F (37.1 C)   Ht 5' 3"  (1.6 m)   Wt 180 lb 12.8 oz (82 kg)   SpO2 99%   BMI 32.03 kg/m    Subjective:    Patient ID: Debra Patton, female    DOB: 11/25/1950, 69 y.o.   MRN: [id number]  Carpal Tunnel Syndrome Patient presents for evaluation of pain in hands. Onset of the symptoms was 6 months ago. Current symptoms include: pain involving the right hand, tingling/numbness involving the right hand and weakness involving the right hand. Aggravating factors: repetitive activity: work and worse at night or first thing in the morning. Symptoms have gradually worsened.      Diabetes Low fat/carbohydrate diet?  Yes Nicotine Abuse?  No Medication Compliance?  Yes Exercise?  Yes Alcohol Abuse?  No  Patient reports that overall she is doing well not having any difficulties with elevated sugars or type II plus Amick events.  She is taking her medicines well not having any difficulties.  Lab Results      Component                Value               Date                      HGBA1C                   6.1                 01/22/2020            No results found for: Derl Barrow  Lab Results      Component                Value               Date                      CHOL                     159                 01/22/2020                HDL                      41                  01/22/2020                LDLCALC                  99                  01/22/2020                TRIG                     104                 01/22/2020                CHOLHDL                  3.9  01/22/2020             Hypertension Patient has had good readings on her blood pressure and denies having any other issues at this time.  She does need refills and labs to be updated.  She denies any chest pain, shortness of breath.          1. Diabetes mellitus without complication ([place])  2. Essential hypertension  3. Carpal tunnel syndrome of right  wrist   HPI: Debra Patton is a 69 y.o. female presenting on 01/22/2020 for Medical Management of Chronic Issues, Hypertension, Diabetes, and Hyperlipidemia    Past Medical History:  Diagnosis Date  . Allergy   . Diabetes mellitus without complication ([place])   . History of acute pancreatitis   . History of herpes zoster   . Hyperlipidemia   . Hypertension    Relevant past medical, surgical, family and social history reviewed and updated as indicated. Interim medical history since our last visit reviewed. Allergies and medications reviewed and updated. DATA REVIEWED: CHART IN EPIC  Family History reviewed for pertinent findings.  Review of Systems  Constitutional: Negative.  Negative for activity change, fatigue and fever.  HENT: Negative.   Eyes: Negative.   Respiratory: Negative.  Negative for cough.   Cardiovascular: Negative.  Negative for chest pain.  Gastrointestinal: Negative.  Negative for abdominal pain.  Endocrine: Negative.   Genitourinary: Negative.  Negative for dysuria.  Musculoskeletal: Positive for arthralgias, gait problem and joint swelling.  Skin: Negative.   Neurological: Positive for weakness.    Allergies as of 01/22/2020      Reactions   Ivp Dye [iodinated Diagnostic Agents]       Medication List       Accurate as of January 22, 2020 11:59 PM. If you have any questions, ask your nurse or doctor.        STOP taking these medications   estradiol 0.1 MG/GM vaginal cream Commonly known as: ESTRACE Stopped by: Terald Sleeper, PA-C     TAKE these medications   acetaminophen 500 MG tablet Commonly known as: TYLENOL Take 500 mg by mouth every 6 (six) hours as needed.   aspirin-sod bicarb-citric acid 325 MG Tbef tablet Commonly known as: ALKA-SELTZER Take 325 mg by mouth every 6 (six) hours as needed.   augmented betamethasone dipropionate 0.05 % cream Commonly known as: DIPROLENE-AF Apply topically 2 (two) times daily.   Clobetasol Prop  Emollient Base 0.05 % emollient cream APPLY TO AFFECTED AREA TWICE A DAY   diphenhydramine-acetaminophen 25-500 MG Tabs tablet Commonly known as: TYLENOL PM Take 1 tablet by mouth at bedtime as needed.   latanoprost 0.005 % ophthalmic solution Commonly known as: XALATAN TAKE 1 DROP(S) IN BOTH EYES ONCE IN THE EVENING   lisinopril-hydrochlorothiazide 10-12.5 MG tablet Commonly known as: ZESTORETIC TAKE 1 TABLET BY MOUTH EVERY DAY   metFORMIN 500 MG tablet Commonly known as: GLUCOPHAGE Take 1 tablet (500 mg total) by mouth daily with breakfast.   naproxen 500 MG tablet Commonly known as: Naprosyn Take 1 tablet (500 mg total) by mouth 2 (two) times daily with a meal.   pravastatin 20 MG tablet Commonly known as: PRAVACHOL TAKE 1 TABLET BY MOUTH EVERY DAY          Objective:    BP 114/66   Pulse 83   Temp 98.7 F (37.1 C)   Ht 5' 3"  (1.6 m)   Wt 180 lb 12.8 oz (82 kg)   SpO2 99%  BMI 32.03 kg/m   Allergies  Allergen Reactions  . Ivp Dye [Iodinated Diagnostic Agents]     Wt Readings from Last 3 Encounters:  01/22/20 180 lb 12.8 oz (82 kg)  07/10/19 174 lb (78.9 kg)  04/05/19 178 lb 12.8 oz (81.1 kg)    Physical Exam Constitutional:      General: She is not in acute distress.    Appearance: Normal appearance. She is well-developed.  HENT:     Head: Normocephalic and atraumatic.  Cardiovascular:     Rate and Rhythm: Normal rate.  Pulmonary:     Effort: Pulmonary effort is normal.  Musculoskeletal:     Right wrist: Tenderness present.     Left wrist: Tenderness present.     Comments: Positive Tinel and Phalen sign  Skin:    General: Skin is warm and dry.     Findings: No rash.  Neurological:     Mental Status: She is alert and oriented to person, place, and time.     Deep Tendon Reflexes: Reflexes are normal and symmetric.     Results for orders placed or performed in visit on 01/22/20  Bayer DCA Hb A1c Waived  Result Value Ref Range   HB A1C  (BAYER DCA - WAIVED) 6.1 <7.0 %  Lipid panel  Result Value Ref Range   Cholesterol, Total 159 100 - 199 mg/dL   Triglycerides 104 0 - 149 mg/dL   HDL 41 >39 mg/dL   VLDL Cholesterol Cal 19 5 - 40 mg/dL   LDL Chol Calc (NIH) 99 0 - 99 mg/dL   Chol/HDL Ratio 3.9 0.0 - 4.4 ratio  CMP14+EGFR  Result Value Ref Range   Glucose 94 65 - 99 mg/dL   BUN 23 8 - 27 mg/dL   Creatinine, Ser 0.99 0.57 - 1.00 mg/dL   GFR calc non Af Amer 59 (L) >59 mL/min/1.73   GFR calc Af Amer 68 >59 mL/min/1.73   BUN/Creatinine Ratio 23 12 - 28   Sodium 141 134 - 144 mmol/L   Potassium 4.4 3.5 - 5.2 mmol/L   Chloride 102 96 - 106 mmol/L   CO2 24 20 - 29 mmol/L   Calcium 9.5 8.7 - 10.3 mg/dL   Total Protein 6.9 6.0 - 8.5 g/dL   Albumin 4.1 3.8 - 4.8 g/dL   Globulin, Total 2.8 1.5 - 4.5 g/dL   Albumin/Globulin Ratio 1.5 1.2 - 2.2   Bilirubin Total <0.2 0.0 - 1.2 mg/dL   Alkaline Phosphatase 96 39 - 117 IU/L   AST 17 0 - 40 IU/L   ALT 14 0 - 32 IU/L  TSH  Result Value Ref Range   TSH 1.270 0.450 - 4.500 uIU/mL  CBC with Differential/Platelet  Result Value Ref Range   WBC 4.6 3.4 - 10.8 x10E3/uL   RBC 4.17 3.77 - 5.28 x10E6/uL   Hemoglobin 11.4 11.1 - 15.9 g/dL   Hematocrit 34.0 34.0 - 46.6 %   MCV 82 79 - 97 fL   MCH 27.3 26.6 - 33.0 pg   MCHC 33.5 31.5 - 35.7 g/dL   RDW 12.9 11.7 - 15.4 %   Platelets 368 150 - 450 x10E3/uL   Neutrophils 62 Not Estab. %   Lymphs 17 Not Estab. %   Monocytes 15 Not Estab. %   Eos 6 Not Estab. %   Basos 0 Not Estab. %   Neutrophils Absolute 2.8 1.4 - 7.0 x10E3/uL   Lymphocytes Absolute 0.8 0.7 - 3.1 x10E3/uL  Monocytes Absolute 0.7 0.1 - 0.9 x10E3/uL   EOS (ABSOLUTE) 0.3 0.0 - 0.4 x10E3/uL   Basophils Absolute 0.0 0.0 - 0.2 x10E3/uL   Immature Granulocytes 0 Not Estab. %   Immature Grans (Abs) 0.0 0.0 - 0.1 x10E3/uL      Assessment & Plan:   1. Diabetes mellitus without complication ([place]) - Bayer DCA Hb A1c Waived - Lipid panel - CMP14+EGFR - TSH - CBC  with Differential/Platelet  2. Essential hypertension - Lipid panel - CMP14+EGFR - TSH - CBC with Differential/Platelet  3. Carpal tunnel syndrome of right wrist - Ambulatory referral to Hand Surgery   Continue all other maintenance medications as listed above.  Follow up plan: Return in about 6 months (around 07/24/2020).  Educational handout given for carpal tunnel  Terald Sleeper PA-C [facility] [address]  [place], [place] [address] [phone]   01/23/2020, 12:58 PM

## 2020-01-30 ENCOUNTER — Telehealth: Payer: Self-pay | Admitting: Physician Assistant

## 2020-01-30 NOTE — Telephone Encounter (Signed)
Pt aware of results 

## 2020-02-02 ENCOUNTER — Other Ambulatory Visit: Payer: Self-pay

## 2020-02-02 ENCOUNTER — Ambulatory Visit: Payer: Medicaid Other | Attending: Internal Medicine

## 2020-02-02 DIAGNOSIS — Z20822 Contact with and (suspected) exposure to covid-19: Secondary | ICD-10-CM | POA: Diagnosis not present

## 2020-02-03 ENCOUNTER — Telehealth: Payer: Self-pay | Admitting: Physician Assistant

## 2020-02-03 LAB — NOVEL CORONAVIRUS, NAA: SARS-CoV-2, NAA: DETECTED — AB

## 2020-02-03 LAB — SARS-COV-2, NAA 2 DAY TAT

## 2020-02-03 NOTE — Telephone Encounter (Signed)
Patient called back for test result would like a call back with the results  Ph# [phone]

## 2020-02-19 DIAGNOSIS — R200 Anesthesia of skin: Secondary | ICD-10-CM | POA: Diagnosis not present

## 2020-02-19 DIAGNOSIS — H524 Presbyopia: Secondary | ICD-10-CM | POA: Diagnosis not present

## 2020-02-19 DIAGNOSIS — H52222 Regular astigmatism, left eye: Secondary | ICD-10-CM | POA: Diagnosis not present

## 2020-03-01 ENCOUNTER — Other Ambulatory Visit: Payer: Self-pay | Admitting: *Deleted

## 2020-03-01 DIAGNOSIS — E119 Type 2 diabetes mellitus without complications: Secondary | ICD-10-CM

## 2020-03-01 MED ORDER — METFORMIN HCL 500 MG PO TABS: 500.0000 mg | ORAL_TABLET | Freq: Every day | ORAL | 1 refills | Status: DC

## 2020-03-01 NOTE — Telephone Encounter (Signed)
OV 01/22/20 rtc 6 mos

## 2020-03-11 ENCOUNTER — Encounter: Payer: Self-pay | Admitting: *Deleted

## 2020-04-05 ENCOUNTER — Encounter: Payer: Self-pay | Admitting: Nurse Practitioner

## 2020-04-05 ENCOUNTER — Other Ambulatory Visit: Payer: Self-pay

## 2020-04-05 ENCOUNTER — Ambulatory Visit (INDEPENDENT_AMBULATORY_CARE_PROVIDER_SITE_OTHER): Payer: Medicare Other | Admitting: Nurse Practitioner

## 2020-04-05 VITALS — BP 121/69 | HR 83 | Temp 97.9°F | Ht 63.0 in | Wt 176.5 lb

## 2020-04-05 DIAGNOSIS — E1169 Type 2 diabetes mellitus with other specified complication: Secondary | ICD-10-CM | POA: Diagnosis not present

## 2020-04-05 DIAGNOSIS — E785 Hyperlipidemia, unspecified: Secondary | ICD-10-CM

## 2020-04-05 DIAGNOSIS — I10 Essential (primary) hypertension: Secondary | ICD-10-CM

## 2020-04-05 DIAGNOSIS — E119 Type 2 diabetes mellitus without complications: Secondary | ICD-10-CM | POA: Diagnosis not present

## 2020-04-05 DIAGNOSIS — Z6831 Body mass index (BMI) 31.0-31.9, adult: Secondary | ICD-10-CM | POA: Diagnosis not present

## 2020-04-05 DIAGNOSIS — Z6832 Body mass index (BMI) 32.0-32.9, adult: Secondary | ICD-10-CM | POA: Insufficient documentation

## 2020-04-05 LAB — BAYER DCA HB A1C WAIVED: HB A1C (BAYER DCA - WAIVED): 5.6 % (ref <7.0)

## 2020-04-05 MED ORDER — PRAVASTATIN SODIUM 20 MG PO TABS: 20.0000 mg | ORAL_TABLET | Freq: Every day | ORAL | 1 refills | Status: DC

## 2020-04-05 MED ORDER — LISINOPRIL-HYDROCHLOROTHIAZIDE 10-12.5 MG PO TABS: 1.0000 | ORAL_TABLET | Freq: Every day | ORAL | 1 refills | Status: DC

## 2020-04-05 MED ORDER — MELOXICAM 7.5 MG PO TABS: 7.5000 mg | ORAL_TABLET | Freq: Every day | ORAL | 1 refills | Status: DC

## 2020-04-05 NOTE — Progress Notes (Signed)
Subjective:    Patient ID: Debra Patton, female    DOB: 01-Oct-1951, 69 y.o.   MRN: [id number]   Chief Complaint: Medical Management of Chronic Issues, Diabetes, Hypertension, and Hyperlipidemia    HPI:  1. Diabetes mellitus without complication ([place]) He does not check her blood sugars daily. She does not take her metformin daily. Only take a few times a week. She really would like to try something ele if needed. Lab Results  Component Value Date   HGBA1C 6.1 01/22/2020    2. Essential hypertension No c/o chest pain, sob or headache. Does not check her blood pressure at home. BP Readings from Last 3 Encounters:  04/05/20 121/69  01/22/20 114/66  07/10/19 119/61     3. Hyperlipidemia associated with type 2 diabetes mellitus ([place]) Does watch diet but doe no dedicated exercise. Has plans to start walking again. Lab Results  Component Value Date   CHOL 159 01/22/2020   HDL 41 01/22/2020   LDLCALC 99 01/22/2020   TRIG 104 01/22/2020   CHOLHDL 3.9 01/22/2020     4. BMI 32.0-32.9,adult weight is down 4lb from last viist Wt Readings from Last 3 Encounters:  04/05/20 176 lb 8 oz (80.1 kg)  01/22/20 180 lb 12.8 oz (82 kg)  07/10/19 174 lb (78.9 kg)   BMI Readings from Last 3 Encounters:  04/05/20 31.27 kg/m  01/22/20 32.03 kg/m  07/10/19 30.82 kg/m       Outpatient Encounter Medications as of 04/05/2020  Medication Sig  . acetaminophen (TYLENOL) 500 MG tablet Take 500 mg by mouth every 6 (six) hours as needed.  Marland Kitchen aspirin-sod bicarb-citric acid (ALKA-SELTZER) 325 MG TBEF tablet Take 325 mg by mouth every 6 (six) hours as needed.  Marland Kitchen augmented betamethasone dipropionate (DIPROLENE-AF) 0.05 % cream Apply topically 2 (two) times daily. (Patient not taking: Reported on 01/22/2020)  . Clobetasol Prop Emollient Base 0.05 % emollient cream APPLY TO AFFECTED AREA TWICE A DAY  . diphenhydramine-acetaminophen (TYLENOL PM) 25-500 MG TABS tablet Take 1 tablet by mouth at  bedtime as needed.  . latanoprost (XALATAN) 0.005 % ophthalmic solution TAKE 1 DROP(S) IN BOTH EYES ONCE IN THE EVENING  . lisinopril-hydrochlorothiazide (ZESTORETIC) 10-12.5 MG tablet TAKE 1 TABLET BY MOUTH EVERY DAY  . metFORMIN (GLUCOPHAGE) 500 MG tablet Take 1 tablet (500 mg total) by mouth daily with breakfast.  . naproxen (NAPROSYN) 500 MG tablet Take 1 tablet (500 mg total) by mouth 2 (two) times daily with a meal.  . pravastatin (PRAVACHOL) 20 MG tablet TAKE 1 TABLET BY MOUTH EVERY DAY     Past Surgical History:  Procedure Laterality Date  . CARPAL TUNNEL RELEASE    . CHOLECYSTECTOMY    . NECK SURGERY    . TUBAL LIGATION    . TUMOR REMOVAL Left 2016   Neck     Family History  Problem Relation Age of Onset  . Hypertension Mother   . Diabetes Mother   . Stroke Mother   . Vision loss Mother   . Asthma Mother   . Alcohol abuse Father   . Cancer Father   . Stroke Father   . Hypertension Sister   . Early death Brother   . Cancer Brother        Brain tumor   . Diabetes Sister   . Hypertension Sister   . Cancer Brother   . Early death Brother   . Diabetes Brother   . Hypertension Brother   . Hypertension Brother   .  Cancer Brother   . Diabetes Brother   . Hypertension Brother   . Cancer Brother   . Diabetes Brother   . Hypertension Brother   . Vision loss Brother   . 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  Eyes: Negative for pain.  Respiratory: Negative for shortness of breath.   Cardiovascular: Negative for chest pain, palpitations and leg swelling.  Gastrointestinal: Negative for abdominal pain.  Endocrine: Negative for polydipsia.  Skin: Negative for rash.  Neurological: Negative for dizziness, weakness and headaches.  Hematological: Does not bruise/bleed easily.  All other systems  reviewed and are negative.      Objective:   Physical Exam Vitals and nursing note reviewed.  Constitutional:      General: She is not in acute distress.    Appearance: Normal appearance. She is well-developed.  HENT:     Head: Normocephalic.     Nose: Nose normal.  Eyes:     Pupils: Pupils are equal, round, and reactive to light.  Neck:     Vascular: No carotid bruit or JVD.  Cardiovascular:     Rate and Rhythm: Normal rate and regular rhythm.     Heart sounds: Normal heart sounds.  Pulmonary:     Effort: Pulmonary effort is normal. No respiratory distress.     Breath sounds: Normal breath sounds. No wheezing or rales.  Chest:     Chest wall: No tenderness.  Abdominal:     General: Bowel sounds are normal. There is no distension or abdominal bruit.     Palpations: Abdomen is soft. There is no hepatomegaly, splenomegaly, mass or pulsatile mass.     Tenderness: There is no abdominal tenderness.  Musculoskeletal:        General: Normal range of motion.     Cervical back: Normal range of motion and neck supple.  Lymphadenopathy:     Cervical: No cervical adenopathy.  Skin:    General: Skin is warm and dry.  Neurological:     Mental Status: She is alert and oriented to person, place, and time.     Deep Tendon Reflexes: Reflexes are normal and symmetric.  Psychiatric:        Behavior: Behavior normal.        Thought Content: Thought content normal.        Judgment: Judgment normal.    Blood pressure 121/69, pulse 83, temperature 97.9 F (36.6 C), height 5' 3"  (1.6 m), weight 176 lb 8 oz (80.1 kg), SpO2 95 %.  Hgba1c 5.6        Assessment & Plan:  [name] comes in today with chief complaint of Medical Management of Chronic Issues, Diabetes, Hypertension, and Hyperlipidemia   Diagnosis and orders addressed:  1. Diabetes mellitus without complication ([place]) *stop metformin all together Diet control - Bayer DCA Hb A1c Waived - meloxicam (MOBIC) 7.5 MG  tablet; Take 1 tablet (7.5 mg total) by mouth daily.  Dispense: 90 tablet; Refill: 1  2. Essential hypertension Low odium diet - CBC with Differential/Platelet - CMP14+EGFR - lisinopril-hydrochlorothiazide (ZESTORETIC) 10-12.5 MG tablet; Take 1 tablet by mouth daily.  Dispense: 90 tablet; Refill: 1  3. Hyperlipidemia associated with type 2 diabetes mellitus (HCC) Low fat diet - Lipid panel - pravastatin (PRAVACHOL) 20 MG tablet; Take 1 tablet (20 mg total) by mouth  daily.  Dispense: 90 tablet; Refill: 1  4. BMI 31.0-31.9,adult Discussed diet and exercise for person with BMI >25 Will recheck weight in 3-6 months    Labs pending Health Maintenance reviewed Diet and exercise encouraged  Follow up plan: 3 month   [place], FNP

## 2020-04-05 NOTE — Patient Instructions (Signed)
Diabetes Mellitus and Foot Care Foot care is an important part of your health, especially when you have diabetes. Diabetes may cause you to have problems because of poor blood flow (circulation) to your feet and legs, which can cause your skin to:  Become thinner and drier.  Break more easily.  Heal more slowly.  Peel and crack. You may also have nerve damage (neuropathy) in your legs and feet, causing decreased feeling in them. This means that you may not notice minor injuries to your feet that could lead to more serious problems. Noticing and addressing any potential problems early is the best way to prevent future foot problems. How to care for your feet Foot hygiene  Wash your feet daily with warm water and mild soap. Do not use hot water. Then, pat your feet and the areas between your toes until they are completely dry. Do not soak your feet as this can dry your skin.  Trim your toenails straight across. Do not dig under them or around the cuticle. File the edges of your nails with an emery board or nail file.  Apply a moisturizing lotion or petroleum jelly to the skin on your feet and to dry, brittle toenails. Use lotion that does not contain alcohol and is unscented. Do not apply lotion between your toes. Shoes and socks  Wear clean socks or stockings every day. Make sure they are not too tight. Do not wear knee-high stockings since they may decrease blood flow to your legs.  Wear shoes that fit properly and have enough cushioning. Always look in your shoes before you put them on to be sure there are no objects inside.  To break in new shoes, wear them for just a few hours a day. This prevents injuries on your feet. Wounds, scrapes, corns, and calluses  Check your feet daily for blisters, cuts, bruises, sores, and redness. If you cannot see the bottom of your feet, use a mirror or ask someone for help.  Do not cut corns or calluses or try to remove them with medicine.  If you  find a minor scrape, cut, or break in the skin on your feet, keep it and the skin around it clean and dry. You may clean these areas with mild soap and water. Do not clean the area with peroxide, alcohol, or iodine.  If you have a wound, scrape, corn, or callus on your foot, look at it several times a day to make sure it is healing and not infected. Check for: ? Redness, swelling, or pain. ? Fluid or blood. ? Warmth. ? Pus or a bad smell. General instructions  Do not cross your legs. This may decrease blood flow to your feet.  Do not use heating pads or hot water bottles on your feet. They may burn your skin. If you have lost feeling in your feet or legs, you may not know this is happening until it is too late.  Protect your feet from hot and cold by wearing shoes, such as at the beach or on hot pavement.  Schedule a complete foot exam at least once a year (annually) or more often if you have foot problems. If you have foot problems, report any cuts, sores, or bruises to your health care provider immediately. Contact a health care provider if:  You have a medical condition that increases your risk of infection and you have any cuts, sores, or bruises on your feet.  You have an injury that is not   healing.  You have redness on your legs or feet.  You feel burning or tingling in your legs or feet.  You have pain or cramps in your legs and feet.  Your legs or feet are numb.  Your feet always feel cold.  You have pain around a toenail. Get help right away if:  You have a wound, scrape, corn, or callus on your foot and: ? You have pain, swelling, or redness that gets worse. ? You have fluid or blood coming from the wound, scrape, corn, or callus. ? Your wound, scrape, corn, or callus feels warm to the touch. ? You have pus or a bad smell coming from the wound, scrape, corn, or callus. ? You have a fever. ? You have a red line going up your leg. Summary  Check your feet every day  for cuts, sores, red spots, swelling, and blisters.  Moisturize feet and legs daily.  Wear shoes that fit properly and have enough cushioning.  If you have foot problems, report any cuts, sores, or bruises to your health care provider immediately.  Schedule a complete foot exam at least once a year (annually) or more often if you have foot problems. This information is not intended to replace advice given to you by your health care provider. Make sure you discuss any questions you have with your health care provider. Document Revised: 07/19/2019 Document Reviewed: 11/27/2016 Elsevier Patient Education  2020 Elsevier Inc.  

## 2020-04-06 LAB — CBC WITH DIFFERENTIAL/PLATELET
Basophils Absolute: 0 10*3/uL (ref 0.0–0.2)
Basos: 0 %
EOS (ABSOLUTE): 0.3 10*3/uL (ref 0.0–0.4)
Eos: 5 %
Hematocrit: 36.3 % (ref 34.0–46.6)
Hemoglobin: 11.8 g/dL (ref 11.1–15.9)
Immature Grans (Abs): 0 10*3/uL (ref 0.0–0.1)
Immature Granulocytes: 0 %
Lymphocytes Absolute: 1.7 10*3/uL (ref 0.7–3.1)
Lymphs: 24 %
MCH: 27.1 pg (ref 26.6–33.0)
MCHC: 32.5 g/dL (ref 31.5–35.7)
MCV: 83 fL (ref 79–97)
Monocytes Absolute: 0.5 10*3/uL (ref 0.1–0.9)
Monocytes: 7 %
Neutrophils Absolute: 4.5 10*3/uL (ref 1.4–7.0)
Neutrophils: 64 %
Platelets: 347 10*3/uL (ref 150–450)
RBC: 4.35 x10E6/uL (ref 3.77–5.28)
RDW: 14 % (ref 11.7–15.4)
WBC: 7.1 10*3/uL (ref 3.4–10.8)

## 2020-04-06 LAB — CMP14+EGFR
ALT: 11 IU/L (ref 0–32)
AST: 11 IU/L (ref 0–40)
Albumin/Globulin Ratio: 1.3 (ref 1.2–2.2)
Albumin: 4.2 g/dL (ref 3.8–4.8)
Alkaline Phosphatase: 81 IU/L (ref 48–121)
BUN/Creatinine Ratio: 19 (ref 12–28)
BUN: 17 mg/dL (ref 8–27)
Bilirubin Total: <0.2 mg/dL (ref 0.0–1.2)
CO2: 23 mmol/L (ref 20–29)
Calcium: 9.5 mg/dL (ref 8.7–10.3)
Chloride: 101 mmol/L (ref 96–106)
Creatinine, Ser: 0.91 mg/dL (ref 0.57–1.00)
GFR calc Af Amer: 76 mL/min/{1.73_m2} (ref >59)
GFR calc non Af Amer: 65 mL/min/{1.73_m2} (ref >59)
Globulin, Total: 3.2 g/dL (ref 1.5–4.5)
Glucose: 118 mg/dL — ABNORMAL HIGH (ref 65–99)
Potassium: 4.2 mmol/L (ref 3.5–5.2)
Sodium: 141 mmol/L (ref 134–144)
Total Protein: 7.4 g/dL (ref 6.0–8.5)

## 2020-04-06 LAB — LIPID PANEL
Chol/HDL Ratio: 3.7 ratio (ref 0.0–4.4)
Cholesterol, Total: 191 mg/dL (ref 100–199)
HDL: 51 mg/dL (ref >39)
LDL Chol Calc (NIH): 116 mg/dL — ABNORMAL HIGH (ref 0–99)
Triglycerides: 134 mg/dL (ref 0–149)
VLDL Cholesterol Cal: 24 mg/dL (ref 5–40)

## 2020-04-17 LAB — HM DIABETES EYE EXAM

## 2020-04-28 ENCOUNTER — Other Ambulatory Visit: Payer: Self-pay

## 2020-04-28 ENCOUNTER — Encounter (HOSPITAL_COMMUNITY): Payer: Self-pay

## 2020-04-28 ENCOUNTER — Ambulatory Visit (HOSPITAL_COMMUNITY)
Admission: EM | Admit: 2020-04-28 | Discharge: 2020-04-28 | Disposition: A | Discharge status: Home / Self Care | Payer: Medicare Other | Attending: Emergency Medicine | Admitting: Emergency Medicine

## 2020-04-28 DIAGNOSIS — L02411 Cutaneous abscess of right axilla: Secondary | ICD-10-CM

## 2020-04-28 HISTORY — DX: Unspecified glaucoma: H40.9

## 2020-04-28 MED ORDER — DOXYCYCLINE HYCLATE 100 MG PO CAPS: 100.0000 mg | ORAL_CAPSULE | Freq: Two times a day (BID) | ORAL | 0 refills | Status: AC

## 2020-04-28 MED ORDER — IBUPROFEN 800 MG PO TABS
800.0000 mg | ORAL_TABLET | Freq: Three times a day (TID) | ORAL | 0 refills | Status: AC
Start: 2020-04-28 — End: ?

## 2020-04-28 NOTE — ED Provider Notes (Signed)
[facility]    CSN: [id number] Arrival date & time: 04/28/20  1015      History   Chief Complaint Chief Complaint  Patient presents with  . Abscess    HPI Debra Patton is a 69 y.o. female history of DMT2, HTN, presenting today for evaluation of abscess.Reports worsening pain and swelling x 5 days. History of similar when younger, no recent abscesses. Denies fevers.   HPI  Past Medical History:  Diagnosis Date  . Allergy   . Diabetes mellitus without complication ([place])   . Glaucoma   . History of acute pancreatitis   . History of herpes zoster   . Hyperlipidemia   . Hypertension     Patient Active Problem List   Diagnosis Date Noted  . BMI 32.0-32.9,adult 04/05/2020  . Carpal tunnel syndrome of right wrist 01/22/2020  . Lichen sclerosus 29/92/4268  . Right arm pain 01/05/2019  . Diabetes mellitus without complication ([place]) 34/19/6222  . Essential hypertension 02/23/2018  . Hyperlipidemia associated with type 2 diabetes mellitus ([place]) 02/23/2018    Past Surgical History:  Procedure Laterality Date  . CARPAL TUNNEL RELEASE    . CHOLECYSTECTOMY    . NECK SURGERY    . TUBAL LIGATION    . TUMOR REMOVAL Left 2016   Neck     OB History   No obstetric history on file.      Home Medications    Prior to Admission medications   Medication Sig Start Date End Date Taking? Authorizing Provider  acetaminophen (TYLENOL) 500 MG tablet Take 500 mg by mouth every 6 (six) hours as needed.    [provider]  aspirin-sod bicarb-citric acid (ALKA-SELTZER) 325 MG TBEF tablet Take 325 mg by mouth every 6 (six) hours as needed.    [provider]  Clobetasol Prop Emollient Base 0.05 % emollient cream APPLY TO AFFECTED AREA TWICE A DAY 10/23/19   Terald Sleeper, PA-C  diphenhydramine-acetaminophen (TYLENOL PM) 25-500 MG TABS tablet Take 1 tablet by mouth at bedtime as needed.    [provider]  doxycycline (VIBRAMYCIN) 100 MG capsule  Take 1 capsule (100 mg total) by mouth 2 (two) times daily for 10 days. 04/28/20 05/08/20  Kinya Meine C, PA-C  ibuprofen (ADVIL) 800 MG tablet Take 1 tablet (800 mg total) by mouth 3 (three) times daily. 04/28/20   Dina Mobley C, PA-C  lisinopril-hydrochlorothiazide (ZESTORETIC) 10-12.5 MG tablet Take 1 tablet by mouth daily. 04/05/20   Hassell Done, Mary-Margaret, FNP  meloxicam (MOBIC) 7.5 MG tablet Take 1 tablet (7.5 mg total) by mouth daily. 04/05/20   Hassell Done, Mary-Margaret, FNP  pravastatin (PRAVACHOL) 20 MG tablet Take 1 tablet (20 mg total) by mouth daily. 04/05/20   Chevis Pretty, FNP    Family History Family History  Problem Relation Age of Onset  . Hypertension Mother   . Diabetes Mother   . Stroke Mother   . Vision loss Mother   . Asthma Mother   . Alcohol abuse Father   . Cancer Father   . Stroke Father   . Hypertension Sister   . Early death Brother   . Cancer Brother        Brain tumor   . Diabetes Sister   . Hypertension Sister   . Cancer Brother   . Early death Brother   . Diabetes Brother   . Hypertension Brother   . Hypertension Brother   . Cancer Brother   . Diabetes Brother   .  Hypertension Brother   . Cancer Brother   . Diabetes Brother   . Hypertension Brother   . Vision loss Brother   . Stroke Brother     Social History Social History   Tobacco Use  . Smoking status: Former Smoker    Quit date: 11/09/2004    Years since quitting: 15.4  . Smokeless tobacco: Never Used  Vaping Use  . Vaping Use: Never used  Substance Use Topics  . Alcohol use: Not Currently  . Drug use: Never     Allergies   Ivp dye [iodinated diagnostic agents]   Review of Systems Review of Systems  Constitutional: Negative for fatigue and fever.  Eyes: Negative for visual disturbance.  Respiratory: Negative for shortness of breath.   Cardiovascular: Negative for chest pain.  Gastrointestinal: Negative for abdominal pain, nausea and vomiting.  Musculoskeletal:  Negative for arthralgias and joint swelling.  Skin: Positive for color change and rash. Negative for wound.  Neurological: Negative for dizziness, weakness, light-headedness and headaches.     Physical Exam Triage Vital Signs ED Triage Vitals  Enc Vitals Group     BP 04/28/20 1046 (!) 142/58     Pulse Rate 04/28/20 1046 87     Resp 04/28/20 1046 16     Temp 04/28/20 1046 98 F (36.7 C)     Temp Source 04/28/20 1046 Oral     SpO2 04/28/20 1046 99 %     Weight 04/28/20 1047 175 lb (79.4 kg)     Height 04/28/20 1047 5\' 2"  (1.575 m)     Head Circumference --      Peak Flow --      Pain Score 04/28/20 1047 4     Pain Loc --      Pain Edu? --      Excl. in GC? --    No data found.  Updated Vital Signs BP (!) 142/58   Pulse 87   Temp 98 F (36.7 C) (Oral)   Resp 16   Ht 5\' 2"  (1.575 m)   Wt 175 lb (79.4 kg)   SpO2 99%   BMI 32.01 kg/m   Visual Acuity Right Eye Distance:   Left Eye Distance:   Bilateral Distance:    Right Eye Near:   Left Eye Near:    Bilateral Near:     Physical Exam Vitals and nursing note reviewed.  Constitutional:      Appearance: She is well-developed.     Comments: No acute distress  HENT:     Head: Normocephalic and atraumatic.     Nose: Nose normal.  Eyes:     Conjunctiva/sclera: Conjunctivae normal.  Cardiovascular:     Rate and Rhythm: Normal rate.  Pulmonary:     Effort: Pulmonary effort is normal. No respiratory distress.  Abdominal:     General: There is no distension.  Musculoskeletal:        General: Normal range of motion.     Cervical back: Neck supple.     Comments: Full active ROM of right shoulder  Skin:    General: Skin is warm and dry.     Comments: Right axilla with 3 cm area of erythema and induration, slightly fluctuant  Neurological:     Mental Status: She is alert and oriented to person, place, and time.      UC Treatments / Results  Labs (all labs ordered are listed, but only abnormal results are  displayed) Labs Reviewed - No data to  display  EKG   Radiology No results found.  Procedures Incision and Drainage  Date/Time: 04/28/2020 9:29 PM Performed by: Allard Lightsey, [place] C, PA-C Authorized by: Derrin Currey, Junius Creamer, PA-C   Consent:    Consent obtained:  Verbal   Consent given by:  Patient   Risks discussed:  Incomplete drainage and pain   Alternatives discussed:  Alternative treatment Location:    Type:  Abscess   Size:  3   Location: r axilla. Pre-procedure details:    Skin preparation:  Betadine Anesthesia (see MAR for exact dosages):    Anesthesia method:  Local infiltration   Local anesthetic:  Lidocaine 2% w/o epi Procedure type:    Complexity:  Simple Procedure details:    Needle aspiration: no     Incision types:  Single straight   Incision depth:  Subcutaneous   Scalpel blade:  11   Wound management:  Probed and deloculated   Drainage:  Bloody and purulent   Drainage amount:  Moderate   Wound treatment:  Wound left open Post-procedure details:    Patient tolerance of procedure:  Tolerated well, no immediate complications   (including critical care time)  Medications Ordered in UC Medications - No data to display  Initial Impression / Assessment and Plan / UC Course  I have reviewed the triage vital signs and the nursing notes.  Pertinent labs & imaging results that were available during my care of the patient were reviewed by me and considered in my medical decision making (see chart for details).     I&D performed, initiating on doxycycline, warm compresses. Follow up if not improving. Discussed strict return precautions. Patient verbalized understanding and is agreeable with plan.  Final Clinical Impressions(s) / UC Diagnoses   Final diagnoses:  Abscess of axilla, right     Discharge Instructions     Please begin doxycycline for 10 days  Apply warm compresses/hot rags to area with massage to express further drainage especially the first  24-48 hours  Use anti-inflammatories for pain/swelling. You may take up to 800 mg Ibuprofen every 8 hours with food. You may supplement Ibuprofen with Tylenol [phone] mg every 8 hours.    Return if symptoms returning or not improving   ED Prescriptions    Medication Sig Dispense Auth. Provider   doxycycline (VIBRAMYCIN) 100 MG capsule Take 1 capsule (100 mg total) by mouth 2 (two) times daily for 10 days. 20 capsule Alayne Estrella C, PA-C   ibuprofen (ADVIL) 800 MG tablet Take 1 tablet (800 mg total) by mouth 3 (three) times daily. 21 tablet Rhemi Balbach, [place] C, PA-C     PDMP not reviewed this encounter.   Lew Dawes, [place] 04/28/20 2131

## 2020-04-28 NOTE — Discharge Instructions (Addendum)
Please begin doxycycline for 10 days ° °Apply warm compresses/hot rags to area with massage to express further drainage especially the first 24-48 hours ° °Use anti-inflammatories for pain/swelling. You may take up to 800 mg Ibuprofen every 8 hours with food. You may supplement Ibuprofen with Tylenol 500-1000 mg every 8 hours.  ° °Return if symptoms returning or not improving ° °

## 2020-04-28 NOTE — ED Triage Notes (Signed)
Pt c/o abscess in right axillax5 days. Pt states it has been getting bigger.

## 2020-05-22 ENCOUNTER — Other Ambulatory Visit: Payer: Self-pay | Admitting: Nurse Practitioner

## 2020-05-22 DIAGNOSIS — Z1231 Encounter for screening mammogram for malignant neoplasm of breast: Secondary | ICD-10-CM

## 2020-06-04 LAB — HM DIABETES EYE EXAM

## 2020-07-16 ENCOUNTER — Ambulatory Visit: Payer: Self-pay | Admitting: Nurse Practitioner

## 2020-07-19 ENCOUNTER — Other Ambulatory Visit: Payer: Self-pay | Admitting: *Deleted

## 2020-07-19 MED ORDER — CLOBETASOL PROP EMOLLIENT BASE 0.05 % EX CREA: TOPICAL_CREAM | CUTANEOUS | 1 refills | Status: DC

## 2020-07-26 ENCOUNTER — Ambulatory Visit: Payer: Medicare HMO | Admitting: Family Medicine

## 2020-07-26 ENCOUNTER — Ambulatory Visit: Payer: Medicare HMO | Admitting: Physician Assistant

## 2020-08-12 ENCOUNTER — Ambulatory Visit (INDEPENDENT_AMBULATORY_CARE_PROVIDER_SITE_OTHER): Payer: Medicare Other | Admitting: Nurse Practitioner

## 2020-08-12 ENCOUNTER — Encounter: Payer: Self-pay | Admitting: Nurse Practitioner

## 2020-08-12 ENCOUNTER — Ambulatory Visit (INDEPENDENT_AMBULATORY_CARE_PROVIDER_SITE_OTHER): Payer: Medicare Other

## 2020-08-12 ENCOUNTER — Other Ambulatory Visit: Payer: Self-pay

## 2020-08-12 VITALS — BP 145/69 | HR 61 | Temp 97.6°F | Resp 20 | Ht 62.0 in | Wt 179.0 lb

## 2020-08-12 DIAGNOSIS — Z6832 Body mass index (BMI) 32.0-32.9, adult: Secondary | ICD-10-CM

## 2020-08-12 DIAGNOSIS — I10 Essential (primary) hypertension: Secondary | ICD-10-CM

## 2020-08-12 DIAGNOSIS — Z1159 Encounter for screening for other viral diseases: Secondary | ICD-10-CM | POA: Diagnosis not present

## 2020-08-12 DIAGNOSIS — E1169 Type 2 diabetes mellitus with other specified complication: Secondary | ICD-10-CM

## 2020-08-12 DIAGNOSIS — E785 Hyperlipidemia, unspecified: Secondary | ICD-10-CM

## 2020-08-12 DIAGNOSIS — Z1231 Encounter for screening mammogram for malignant neoplasm of breast: Secondary | ICD-10-CM

## 2020-08-12 DIAGNOSIS — Z13820 Encounter for screening for osteoporosis: Secondary | ICD-10-CM

## 2020-08-12 DIAGNOSIS — J849 Interstitial pulmonary disease, unspecified: Secondary | ICD-10-CM | POA: Diagnosis not present

## 2020-08-12 DIAGNOSIS — E119 Type 2 diabetes mellitus without complications: Secondary | ICD-10-CM

## 2020-08-12 DIAGNOSIS — Z780 Asymptomatic menopausal state: Secondary | ICD-10-CM

## 2020-08-12 LAB — BAYER DCA HB A1C WAIVED: HB A1C (BAYER DCA - WAIVED): 5.7 % (ref <7.0)

## 2020-08-12 MED ORDER — LISINOPRIL-HYDROCHLOROTHIAZIDE 10-12.5 MG PO TABS: 1.0000 | ORAL_TABLET | Freq: Every day | ORAL | 1 refills | Status: DC

## 2020-08-12 MED ORDER — PRAVASTATIN SODIUM 20 MG PO TABS: 20.0000 mg | ORAL_TABLET | Freq: Every day | ORAL | 1 refills | Status: DC

## 2020-08-12 NOTE — Progress Notes (Signed)
Subjective:    Patient ID: Debra Patton, female    DOB: [birth year], 69 y.o.   MRN: [id number]   Chief Complaint: Medical Management of Chronic Issues    HPI:  1. Essential hypertension No c/o chest pain, sob or headache. Does not check blood pressure at home. Has not taken her blood pressure pill in 3 days. BP Readings from Last 3 Encounters:  08/12/20 (!) 145/69  04/28/20 (!) 142/58  04/05/20 121/69     2. Hyperlipidemia associated with type 2 diabetes mellitus ([place]) Does try to watch diet but does very little exercise. Lab Results  Component Value Date   CHOL 191 04/05/2020   HDL 51 04/05/2020   LDLCALC 116 (H) 04/05/2020   TRIG 134 04/05/2020   CHOLHDL 3.7 04/05/2020     3. Diabetes mellitus without complication ([place]) She doe snot check blood sugars at home. Denies any symptoms of low blood sugars. Lab Results  Component Value Date   HGBA1C 5.6 04/05/2020     4. BMI 32.0-32.9,adult No recent weight changes Wt Readings from Last 3 Encounters:  08/12/20 179 lb (81.2 kg)  04/28/20 175 lb (79.4 kg)  04/05/20 176 lb 8 oz (80.1 kg)   BMI Readings from Last 3 Encounters:  08/12/20 32.74 kg/m  04/28/20 32.01 kg/m  04/05/20 31.27 kg/m       Outpatient Encounter Medications as of 08/12/2020  Medication Sig  . acetaminophen (TYLENOL) 500 MG tablet Take 500 mg by mouth every 6 (six) hours as needed.  Marland Kitchen aspirin-sod bicarb-citric acid (ALKA-SELTZER) 325 MG TBEF tablet Take 325 mg by mouth every 6 (six) hours as needed.  . Clobetasol Prop Emollient Base 0.05 % emollient cream APPLY TO AFFECTED AREA TWICE A DAY  . diphenhydramine-acetaminophen (TYLENOL PM) 25-500 MG TABS tablet Take 1 tablet by mouth at bedtime as needed.  Marland Kitchen ibuprofen (ADVIL) 800 MG tablet Take 1 tablet (800 mg total) by mouth 3 (three) times daily.  Marland Kitchen lisinopril-hydrochlorothiazide (ZESTORETIC) 10-12.5 MG tablet Take 1 tablet by mouth daily.  . meloxicam (MOBIC) 7.5 MG tablet Take 1 tablet  (7.5 mg total) by mouth daily.  . pravastatin (PRAVACHOL) 20 MG tablet Take 1 tablet (20 mg total) by mouth daily.     Past Surgical History:  Procedure Laterality Date  . CARPAL TUNNEL RELEASE    . CHOLECYSTECTOMY    . NECK SURGERY    . TUBAL LIGATION    . TUMOR REMOVAL Left 2016   Neck     Family History  Problem Relation Age of Onset  . Hypertension Mother   . Diabetes Mother   . Stroke Mother   . Vision loss Mother   . Asthma Mother   . Alcohol abuse Father   . Cancer Father   . Stroke Father   . Hypertension Sister   . Early death Brother   . Cancer Brother        Brain tumor   . Diabetes Sister   . Hypertension Sister   . Cancer Brother   . Early death Brother   . Diabetes Brother   . Hypertension Brother   . Hypertension Brother   . Cancer Brother   . Diabetes Brother   . Hypertension Brother   . Cancer Brother   . Diabetes Brother   . Hypertension Brother   . Vision loss Brother   . 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  Eyes: Negative for pain.  Respiratory: Negative for shortness of breath.   Cardiovascular: Negative for chest pain, palpitations and leg swelling.  Gastrointestinal: Negative for abdominal pain.  Endocrine: Negative for polydipsia.  Skin: Negative for rash.  Neurological: Negative for dizziness, weakness and headaches.  Hematological: Does not bruise/bleed easily.  All other systems reviewed and are negative.      Objective:   Physical Exam Vitals and nursing note reviewed.  Constitutional:      General: She is not in acute distress.    Appearance: Normal appearance. She is well-developed.  HENT:     Head: Normocephalic.     Nose: Nose normal.  Eyes:     Pupils: Pupils are equal, round, and reactive to light.  Neck:     Vascular: No carotid bruit or  JVD.  Cardiovascular:     Rate and Rhythm: Normal rate and regular rhythm.     Heart sounds: Normal heart sounds.  Pulmonary:     Effort: Pulmonary effort is normal. No respiratory distress.     Breath sounds: Normal breath sounds. No wheezing or rales.  Chest:     Chest wall: No tenderness.  Abdominal:     General: Bowel sounds are normal. There is no distension or abdominal bruit.     Palpations: Abdomen is soft. There is no hepatomegaly, splenomegaly, mass or pulsatile mass.     Tenderness: There is no abdominal tenderness.  Musculoskeletal:        General: Normal range of motion.     Cervical back: Normal range of motion and neck supple.  Lymphadenopathy:     Cervical: No cervical adenopathy.  Skin:    General: Skin is warm and dry.  Neurological:     Mental Status: She is alert and oriented to person, place, and time.     Deep Tendon Reflexes: Reflexes are normal and symmetric.  Psychiatric:        Behavior: Behavior normal.        Thought Content: Thought content normal.        Judgment: Judgment normal.     BP (!) 145/69   Pulse 61   Temp 97.6 F (36.4 C) (Temporal)   Resp 20   Ht _0  (1.575 m)   Wt 179 lb (81.2 kg)   SpO2 100%   BMI 32.74 kg/m   Chest xray- clear-Preliminary reading by Ronnald Collum, FNP  [facility] EKG- NS[facility]-Mary-Margaret Hassell Done, FNP       Assessment & Plan:  Debra Patton comes in today with chief complaint of Medical Management of Chronic Issues   Diagnosis and orders addressed:  1. Essential hypertension Low sodium diet - lisinopril-hydrochlorothiazide (ZESTORETIC) 10-12.5 MG tablet; Take 1 tablet by mouth daily.  Dispense: 90 tablet; Refill: 1 - CBC with Differential/Platelet - CMP14+EGFR - DG Chest 2 View - EKG 12-Lead  2. Hyperlipidemia associated with type 2 diabetes mellitus ([facility]) Low fta diet - pravastatin (PRAVACHOL) 20 MG tablet; Take 1 tablet (20 mg total) by mouth daily.  Dispense: 90 tablet; Refill: 1 - Lipid  panel  3. Diabetes mellitus without complication ([place]) Watch carbsin diet - Bayer DCA Hb A1c Waived  4. BMI 32.0-32.9,adult Discussed diet and exercise for person with BMI >25 Will recheck weight in 3-6 months  5. Need for hepatitis C screening test Labs pending - Hepatitis C antibody  6. Encounter for screening for osteoporosis -  DG [facility] DEXA  7. Screening mammogram for breast cancer Patient will schedule - MM 3D SCREEN BREAST BILATERAL; Future   Labs pending Health Maintenance reviewed Diet and exercise encouraged  Follow up plan: 6 months   Mary-Margaret Hassell Done, FNP

## 2020-08-12 NOTE — Patient Instructions (Signed)

## 2020-08-13 LAB — CBC WITH DIFFERENTIAL/PLATELET
Basophils Absolute: 0 10*3/uL (ref 0.0–0.2)
Basos: 1 %
EOS (ABSOLUTE): 0.4 10*3/uL (ref 0.0–0.4)
Eos: 6 %
Hematocrit: 34.6 % (ref 34.0–46.6)
Hemoglobin: 11 g/dL — ABNORMAL LOW (ref 11.1–15.9)
Immature Grans (Abs): 0 10*3/uL (ref 0.0–0.1)
Immature Granulocytes: 0 %
Lymphocytes Absolute: 2.5 10*3/uL (ref 0.7–3.1)
Lymphs: 38 %
MCH: 26.3 pg — ABNORMAL LOW (ref 26.6–33.0)
MCHC: 31.8 g/dL (ref 31.5–35.7)
MCV: 83 fL (ref 79–97)
Monocytes Absolute: 0.5 10*3/uL (ref 0.1–0.9)
Monocytes: 8 %
Neutrophils Absolute: 3.2 10*3/uL (ref 1.4–7.0)
Neutrophils: 47 %
Platelets: 385 10*3/uL (ref 150–450)
RBC: 4.18 x10E6/uL (ref 3.77–5.28)
RDW: 14 % (ref 11.7–15.4)
WBC: 6.6 10*3/uL (ref 3.4–10.8)

## 2020-08-13 LAB — LIPID PANEL
Chol/HDL Ratio: 4.3 ratio (ref 0.0–4.4)
Cholesterol, Total: 204 mg/dL — ABNORMAL HIGH (ref 100–199)
HDL: 48 mg/dL (ref >39)
LDL Chol Calc (NIH): 138 mg/dL — ABNORMAL HIGH (ref 0–99)
Triglycerides: 102 mg/dL (ref 0–149)
VLDL Cholesterol Cal: 18 mg/dL (ref 5–40)

## 2020-08-13 LAB — HEPATITIS C ANTIBODY: Hep C Virus Ab: <0.1 s/co ratio (ref 0.0–0.9)

## 2020-08-13 LAB — CMP14+EGFR
ALT: 14 IU/L (ref 0–32)
AST: 14 IU/L (ref 0–40)
Albumin/Globulin Ratio: 1.2 (ref 1.2–2.2)
Albumin: 4 g/dL (ref 3.8–4.8)
Alkaline Phosphatase: 83 IU/L (ref 44–121)
BUN/Creatinine Ratio: 14 (ref 12–28)
BUN: 11 mg/dL (ref 8–27)
Bilirubin Total: 0.2 mg/dL (ref 0.0–1.2)
CO2: 22 mmol/L (ref 20–29)
Calcium: 9.5 mg/dL (ref 8.7–10.3)
Chloride: 106 mmol/L (ref 96–106)
Creatinine, Ser: 0.79 mg/dL (ref 0.57–1.00)
GFR calc Af Amer: 90 mL/min/{1.73_m2} (ref >59)
GFR calc non Af Amer: 78 mL/min/{1.73_m2} (ref >59)
Globulin, Total: 3.3 g/dL (ref 1.5–4.5)
Glucose: 93 mg/dL (ref 65–99)
Potassium: 4.9 mmol/L (ref 3.5–5.2)
Sodium: 144 mmol/L (ref 134–144)
Total Protein: 7.3 g/dL (ref 6.0–8.5)

## 2020-08-23 ENCOUNTER — Other Ambulatory Visit: Payer: Self-pay

## 2020-08-23 DIAGNOSIS — Z9189 Other specified personal risk factors, not elsewhere classified: Secondary | ICD-10-CM

## 2020-08-23 DIAGNOSIS — Z1159 Encounter for screening for other viral diseases: Secondary | ICD-10-CM

## 2020-08-23 NOTE — Progress Notes (Signed)
Patient requesting hep C test - patient husband had hepatitis

## 2020-08-26 DIAGNOSIS — Z1231 Encounter for screening mammogram for malignant neoplasm of breast: Secondary | ICD-10-CM | POA: Diagnosis not present

## 2020-08-27 ENCOUNTER — Other Ambulatory Visit: Payer: Self-pay | Admitting: Family Medicine

## 2020-08-27 ENCOUNTER — Other Ambulatory Visit: Payer: Self-pay

## 2020-08-27 ENCOUNTER — Other Ambulatory Visit: Payer: Medicare Other

## 2020-08-27 DIAGNOSIS — Z1159 Encounter for screening for other viral diseases: Secondary | ICD-10-CM | POA: Diagnosis not present

## 2020-08-27 DIAGNOSIS — E119 Type 2 diabetes mellitus without complications: Secondary | ICD-10-CM

## 2020-08-28 LAB — HEPATITIS C ANTIBODY: Hep C Virus Ab: <0.1 s/co ratio (ref 0.0–0.9)

## 2020-09-03 DIAGNOSIS — H40023 Open angle with borderline findings, high risk, bilateral: Secondary | ICD-10-CM | POA: Diagnosis not present

## 2020-09-12 ENCOUNTER — Other Ambulatory Visit: Payer: Self-pay | Admitting: Family Medicine

## 2020-09-12 DIAGNOSIS — E119 Type 2 diabetes mellitus without complications: Secondary | ICD-10-CM

## 2020-09-28 ENCOUNTER — Other Ambulatory Visit: Payer: Self-pay | Admitting: Family Medicine

## 2020-09-28 DIAGNOSIS — E119 Type 2 diabetes mellitus without complications: Secondary | ICD-10-CM

## 2020-10-19 ENCOUNTER — Other Ambulatory Visit: Payer: Self-pay | Admitting: Nurse Practitioner

## 2020-10-28 ENCOUNTER — Other Ambulatory Visit: Payer: Self-pay | Admitting: Nurse Practitioner

## 2020-10-28 DIAGNOSIS — E119 Type 2 diabetes mellitus without complications: Secondary | ICD-10-CM

## 2020-11-12 ENCOUNTER — Telehealth: Payer: Self-pay

## 2020-11-12 NOTE — Telephone Encounter (Signed)
Patient aware of lab results.

## 2021-01-02 ENCOUNTER — Other Ambulatory Visit: Payer: Self-pay | Admitting: Nurse Practitioner

## 2021-01-08 ENCOUNTER — Ambulatory Visit (INDEPENDENT_AMBULATORY_CARE_PROVIDER_SITE_OTHER): Payer: Medicare Other | Admitting: Family Medicine

## 2021-01-08 ENCOUNTER — Encounter: Payer: Self-pay | Admitting: Family Medicine

## 2021-01-08 ENCOUNTER — Other Ambulatory Visit: Payer: Self-pay

## 2021-01-08 VITALS — BP 129/67 | HR 80 | Temp 96.5°F | Ht 62.0 in | Wt 187.0 lb

## 2021-01-08 DIAGNOSIS — J302 Other seasonal allergic rhinitis: Secondary | ICD-10-CM

## 2021-01-08 MED ORDER — FLUTICASONE PROPIONATE 50 MCG/ACT NA SUSP: 2.0000 | Freq: Every day | NASAL | 5 refills | Status: DC

## 2021-01-08 MED ORDER — CETIRIZINE HCL 10 MG PO TABS: 10.0000 mg | ORAL_TABLET | Freq: Every day | ORAL | 5 refills | Status: AC

## 2021-01-08 NOTE — Progress Notes (Signed)
Assessment & Plan:  1. Seasonal allergies Keep appointment with ophthalmologist tomorrow.  Start Flonase in the a.m. and Zyrtec in the p.m. to treat allergies. - fluticasone (FLONASE) 50 MCG/ACT nasal spray; Place 2 sprays into both nostrils daily.  Dispense: 16 g; Refill: 5 - cetirizine (ZYRTEC) 10 MG tablet; Take 1 tablet (10 mg total) by mouth daily.  Dispense: 30 tablet; Refill: 5   Follow up plan: Return if symptoms worsen or fail to improve.  Deliah Boston, MSN, APRN, FNP-C [facility]  Subjective:   Patient ID: Debra Patton, female    DOB: Feb 13, 1951, 70 y.o.   MRN: [id number]  HPI: Debra Patton is a 70 y.o. female presenting on 01/08/2021 for Headache (Patient states she has been having left sided head pain x 1 week on and off.  States she will get a shooting pain. )  Patient reports a headache on the left side of her head, behind her eye that has been on and off for the past week.  She describes it as shooting at times.  When it occurs it stays until she takes Tylenol and NyQuil.  She had a tooth pulled 2 weeks ago but denies any pain or problems with her mouth.  She has a history of increased pressure in her eyes and has run out of her prescription eyedrops.  She has an appointment tomorrow with ophthalmologist.  She has checked her blood pressure during 1 of these headaches and reports it was 120s over 60s.  Denies any falls or injury.  She does also reports sneezing, runny nose, and itchy eyes for which she takes Benadryl.   ROS: Negative unless specifically indicated above in HPI.   Relevant past medical history reviewed and updated as indicated.   Allergies and medications reviewed and updated.   Current Outpatient Medications:  .  acetaminophen (TYLENOL) 500 MG tablet, Take 500 mg by mouth every 6 (six) hours as needed., Disp: , Rfl:  .  aspirin-sod bicarb-citric acid (ALKA-SELTZER) 325 MG TBEF tablet, Take 325 mg by mouth every 6 (six)  hours as needed., Disp: , Rfl:  .  Clobetasol Prop Emollient Base (CLOBETASOL PROPIONATE E) 0.05 % emollient cream, APPLY TO AFFECTED AREA TWICE A DAY, Disp: 15 g, Rfl: 1 .  diphenhydramine-acetaminophen (TYLENOL PM) 25-500 MG TABS tablet, Take 1 tablet by mouth at bedtime as needed., Disp: , Rfl:  .  ibuprofen (ADVIL) 800 MG tablet, Take 1 tablet (800 mg total) by mouth 3 (three) times daily., Disp: 21 tablet, Rfl: 0 .  lisinopril-hydrochlorothiazide (ZESTORETIC) 10-12.5 MG tablet, Take 1 tablet by mouth daily., Disp: 90 tablet, Rfl: 1 .  meloxicam (MOBIC) 7.5 MG tablet, TAKE 1 TABLET BY MOUTH EVERY DAY, Disp: 90 tablet, Rfl: 1 .  pravastatin (PRAVACHOL) 20 MG tablet, Take 1 tablet (20 mg total) by mouth daily., Disp: 90 tablet, Rfl: 1  Allergies  Allergen Reactions  . Other Anaphylaxis  . Ivp Dye [Iodinated Diagnostic Agents]     Objective:   BP 129/67   Pulse 80   Temp (!) 96.5 F (35.8 C) (Temporal)   Ht 5\' 2"  (1.575 m)   Wt 187 lb (84.8 kg)   SpO2 93%   BMI 34.20 kg/m    Physical Exam Vitals reviewed.  Constitutional:      General: She is not in acute distress.    Appearance: Normal appearance. She is not ill-appearing, toxic-appearing or diaphoretic.  HENT:     Head: Normocephalic and atraumatic.  Eyes:     General: No scleral icterus.       Right eye: No discharge.        Left eye: No discharge.     Conjunctiva/sclera: Conjunctivae normal.  Cardiovascular:     Rate and Rhythm: Normal rate.  Pulmonary:     Effort: Pulmonary effort is normal. No respiratory distress.  Musculoskeletal:        General: Normal range of motion.     Cervical back: Normal range of motion.  Skin:    General: Skin is warm and dry.     Capillary Refill: Capillary refill takes less than 2 seconds.  Neurological:     General: No focal deficit present.     Mental Status: She is alert and oriented to person, place, and time. Mental status is at baseline.  Psychiatric:        Mood and  Affect: Mood normal.        Behavior: Behavior normal.        Thought Content: Thought content normal.        Judgment: Judgment normal.

## 2021-01-09 DIAGNOSIS — H40023 Open angle with borderline findings, high risk, bilateral: Secondary | ICD-10-CM | POA: Diagnosis not present

## 2021-01-09 LAB — HM DIABETES EYE EXAM

## 2021-01-16 DIAGNOSIS — L900 Lichen sclerosus et atrophicus: Secondary | ICD-10-CM | POA: Diagnosis not present

## 2021-01-16 DIAGNOSIS — L258 Unspecified contact dermatitis due to other agents: Secondary | ICD-10-CM | POA: Diagnosis not present

## 2021-02-10 ENCOUNTER — Ambulatory Visit: Payer: Self-pay | Admitting: Nurse Practitioner

## 2021-02-11 ENCOUNTER — Encounter: Payer: Self-pay | Admitting: Nurse Practitioner

## 2021-02-11 ENCOUNTER — Ambulatory Visit (INDEPENDENT_AMBULATORY_CARE_PROVIDER_SITE_OTHER): Payer: Medicare Other | Admitting: Nurse Practitioner

## 2021-02-11 ENCOUNTER — Other Ambulatory Visit: Payer: Self-pay

## 2021-02-11 VITALS — BP 124/68 | HR 71 | Temp 96.3°F | Resp 20 | Ht 62.0 in | Wt 188.0 lb

## 2021-02-11 DIAGNOSIS — E119 Type 2 diabetes mellitus without complications: Secondary | ICD-10-CM | POA: Diagnosis not present

## 2021-02-11 DIAGNOSIS — I10 Essential (primary) hypertension: Secondary | ICD-10-CM | POA: Diagnosis not present

## 2021-02-11 DIAGNOSIS — E785 Hyperlipidemia, unspecified: Secondary | ICD-10-CM | POA: Diagnosis not present

## 2021-02-11 DIAGNOSIS — Z6832 Body mass index (BMI) 32.0-32.9, adult: Secondary | ICD-10-CM

## 2021-02-11 DIAGNOSIS — E1169 Type 2 diabetes mellitus with other specified complication: Secondary | ICD-10-CM

## 2021-02-11 LAB — BAYER DCA HB A1C WAIVED: HB A1C (BAYER DCA - WAIVED): 5.8 % (ref <7.0)

## 2021-02-11 MED ORDER — ROSUVASTATIN CALCIUM 10 MG PO TABS
10.0000 mg | ORAL_TABLET | Freq: Every day | ORAL | 1 refills | Status: DC
Start: 2021-02-11 — End: 2021-08-11

## 2021-02-11 MED ORDER — LISINOPRIL-HYDROCHLOROTHIAZIDE 10-12.5 MG PO TABS: 1.0000 | ORAL_TABLET | Freq: Every day | ORAL | 1 refills | Status: DC

## 2021-02-11 NOTE — Patient Instructions (Signed)
Exercising to Stay Healthy To become healthy and stay healthy, it is recommended that you do moderate-intensity and vigorous-intensity exercise. You can tell that you are exercising at a moderate intensity if your heart starts beating faster and you start breathing faster but can still hold a conversation. You can tell that you are exercising at a vigorous intensity if you are breathing much harder and faster and cannot hold a conversation while exercising. Exercising regularly is important. It has many health benefits, such as:  Improving overall fitness, flexibility, and endurance.  Increasing bone density.  Helping with weight control.  Decreasing body fat.  Increasing muscle strength.  Reducing stress and tension.  Improving overall health. How often should I exercise? Choose an activity that you enjoy, and set realistic goals. Your health care provider can help you make an activity plan that works for you. Exercise regularly as told by your health care provider. This may include:  Doing strength training two times a week, such as: ? Lifting weights. ? Using resistance bands. ? Push-ups. ? Sit-ups. ? Yoga.  Doing a certain intensity of exercise for a given amount of time. Choose from these options: ? A total of 150 minutes of moderate-intensity exercise every week. ? A total of 75 minutes of vigorous-intensity exercise every week. ? A mix of moderate-intensity and vigorous-intensity exercise every week. Children, pregnant women, people who have not exercised regularly, people who are overweight, and older adults may need to talk with a health care provider about what activities are safe to do. If you have a medical condition, be sure to talk with your health care provider before you start a new exercise program. What are some exercise ideas? Moderate-intensity exercise ideas include:  Walking 1 mile (1.6 km) in about 15  minutes.  Biking.  Hiking.  Golfing.  Dancing.  Water aerobics. Vigorous-intensity exercise ideas include:  Walking 4.5 miles (7.2 km) or more in about 1 hour.  Jogging or running 5 miles (8 km) in about 1 hour.  Biking 10 miles (16.1 km) or more in about 1 hour.  Lap swimming.  Roller-skating or in-line skating.  Cross-country skiing.  Vigorous competitive sports, such as football, basketball, and soccer.  Jumping rope.  Aerobic dancing.   What are some everyday activities that can help me to get exercise?  Yard work, such as: ? Pushing a lawn mower. ? Raking and bagging leaves.  Washing your car.  Pushing a stroller.  Shoveling snow.  Gardening.  Washing windows or floors. How can I be more active in my day-to-day activities?  Use stairs instead of an elevator.  Take a walk during your lunch break.  If you drive, park your car farther away from your work or school.  If you take public transportation, get off one stop early and walk the rest of the way.  Stand up or walk around during all of your indoor phone calls.  Get up, stretch, and walk around every 30 minutes throughout the day.  Enjoy exercise with a friend. Support to continue exercising will help you keep a regular routine of activity. What guidelines can I follow while exercising?  Before you start a new exercise program, talk with your health care provider.  Do not exercise so much that you hurt yourself, feel dizzy, or get very short of breath.  Wear comfortable clothes and wear shoes with good support.  Drink plenty of water while you exercise to prevent dehydration or heat stroke.  Work out until   your breathing and your heartbeat get faster. Where to find more information  U.S. Department of Health and Human Services: www.hhs.gov  Centers for Disease Control and Prevention (CDC): www.cdc.gov Summary  Exercising regularly is important. It will improve your overall fitness,  flexibility, and endurance.  Regular exercise also will improve your overall health. It can help you control your weight, reduce stress, and improve your bone density.  Do not exercise so much that you hurt yourself, feel dizzy, or get very short of breath.  Before you start a new exercise program, talk with your health care provider. This information is not intended to replace advice given to you by your health care provider. Make sure you discuss any questions you have with your health care provider. Document Revised: 10/08/2017 Document Reviewed: 09/16/2017 Elsevier Patient Education  2021 Elsevier Inc.  

## 2021-02-11 NOTE — Progress Notes (Signed)
Subjective:    Patient ID: Debra Patton, female    DOB: Apr 05, 1951, 70 y.o.   MRN: [id number]   Chief Complaint: Medical Management of Chronic Issues    HPI:  1. Essential hypertension No c/o chest pain, sob or headache. Does not check blood pressure at home. BP Readings from Last 3 Encounters:  02/11/21 124/68  01/08/21 129/67  08/12/20 (!) 145/69     2. Hyperlipidemia associated with type 2 diabetes mellitus ([place]) Does not watch diet and does little to no exercise. Lab Results  Component Value Date   CHOL 204 (H) 08/12/2020   HDL 48 08/12/2020   LDLCALC 138 (H) 08/12/2020   TRIG 102 08/12/2020   CHOLHDL 4.3 08/12/2020   The 10-year ASCVD risk score Mikey Bussing DC Jr., et al., 2013) is: 23%   3. Diabetes mellitus without complication ([place]) She does not check her blood sugars at home. She doe snot eat a lot of sweets and drinks water instead of soda. Lab Results  Component Value Date   HGBA1C 5.7 08/12/2020     4. BMI 32.0-32.9,adult  Wt Readings from Last 3 Encounters:  02/11/21 188 lb (85.3 kg)  01/08/21 187 lb (84.8 kg)  08/12/20 179 lb (81.2 kg)   BMI Readings from Last 3 Encounters:  02/11/21 34.39 kg/m  01/08/21 34.20 kg/m  08/12/20 32.74 kg/m       Outpatient Encounter Medications as of 02/11/2021  Medication Sig  . acetaminophen (TYLENOL) 500 MG tablet Take 500 mg by mouth every 6 (six) hours as needed.  Marland Kitchen aspirin-sod bicarb-citric acid (ALKA-SELTZER) 325 MG TBEF tablet Take 325 mg by mouth every 6 (six) hours as needed.  . cetirizine (ZYRTEC) 10 MG tablet Take 1 tablet (10 mg total) by mouth daily.  . Clobetasol Prop Emollient Base (CLOBETASOL PROPIONATE E) 0.05 % emollient cream APPLY TO AFFECTED AREA TWICE A DAY  . diphenhydramine-acetaminophen (TYLENOL PM) 25-500 MG TABS tablet Take 1 tablet by mouth at bedtime as needed.  Marland Kitchen ibuprofen (ADVIL) 800 MG tablet Take 1 tablet (800 mg total) by mouth 3 (three) times daily.  Marland Kitchen  lisinopril-hydrochlorothiazide (ZESTORETIC) 10-12.5 MG tablet Take 1 tablet by mouth daily.  . meloxicam (MOBIC) 7.5 MG tablet TAKE 1 TABLET BY MOUTH EVERY DAY  . pravastatin (PRAVACHOL) 20 MG tablet Take 1 tablet (20 mg total) by mouth daily.  . [DISCONTINUED] fluticasone (FLONASE) 50 MCG/ACT nasal spray Place 2 sprays into both nostrils daily.   No facility-administered encounter medications on file as of 02/11/2021.    Past Surgical History:  Procedure Laterality Date  . CARPAL TUNNEL RELEASE    . CHOLECYSTECTOMY    . NECK SURGERY    . TUBAL LIGATION    . TUMOR REMOVAL Left 2016   Neck     Family History  Problem Relation Age of Onset  . Hypertension Mother   . Diabetes Mother   . Stroke Mother   . Vision loss Mother   . Asthma Mother   . Alcohol abuse Father   . Cancer Father   . Stroke Father   . Hypertension Sister   . Early death Brother   . Cancer Brother        Brain tumor   . Diabetes Sister   . Hypertension Sister   . Cancer Brother   . Early death Brother   . Diabetes Brother   . Hypertension Brother   . Hypertension Brother   . Cancer Brother   . Diabetes Brother   .  Hypertension Brother   . Cancer Brother   . Diabetes Brother   . Hypertension Brother   . Vision loss Brother   . Stroke Brother     New complaints: None today  Social history: Lives with her  Controlled substance contract: n/a    Review of Systems  Constitutional: Negative for diaphoresis.  Eyes: Negative for pain.  Respiratory: Negative for shortness of breath.   Cardiovascular: Negative for chest pain, palpitations and leg swelling.  Gastrointestinal: Negative for abdominal pain.  Endocrine: Negative for polydipsia.  Skin: Negative for rash.  Neurological: Negative for dizziness, weakness and headaches.  Hematological: Does not bruise/bleed easily.  All other systems reviewed and are negative.      Objective:   Physical Exam Vitals and nursing note reviewed.   Constitutional:      General: She is not in acute distress.    Appearance: Normal appearance. She is well-developed.  HENT:     Head: Normocephalic.     Nose: Nose normal.  Eyes:     Pupils: Pupils are equal, round, and reactive to light.  Neck:     Vascular: No carotid bruit or JVD.  Cardiovascular:     Rate and Rhythm: Normal rate and regular rhythm.     Heart sounds: Normal heart sounds.  Pulmonary:     Effort: Pulmonary effort is normal. No respiratory distress.     Breath sounds: Normal breath sounds. No wheezing or rales.  Chest:     Chest wall: No tenderness.  Abdominal:     General: Bowel sounds are normal. There is no distension or abdominal bruit.     Palpations: Abdomen is soft. There is no hepatomegaly, splenomegaly, mass or pulsatile mass.     Tenderness: There is no abdominal tenderness.  Musculoskeletal:        General: Normal range of motion.     Cervical back: Normal range of motion and neck supple.  Lymphadenopathy:     Cervical: No cervical adenopathy.  Skin:    General: Skin is warm and dry.  Neurological:     Mental Status: She is alert and oriented to person, place, and time.     Deep Tendon Reflexes: Reflexes are normal and symmetric.  Psychiatric:        Behavior: Behavior normal.        Thought Content: Thought content normal.        Judgment: Judgment normal.     BP 124/68   Pulse 71   Temp (!) 96.3 F (35.7 C) (Temporal)   Resp 20   Ht 5' 2"  (1.575 m)   Wt 188 lb (85.3 kg)   SpO2 93%   BMI 34.39 kg/m   HGBA1c 5.8%      Assessment & Plan:  Debra Patton comes in today with chief complaint of Medical Management of Chronic Issues   Diagnosis and orders addressed:  1. Essential hypertension Low sodium diet - CBC with Differential/Platelet - CMP14+EGFR  2. Hyperlipidemia associated with type 2 diabetes mellitus ([place]) Low fat diet Stop pravachol Start crestor 59m daily - Lipid panel  3. Diabetes mellitus without  complication ([place] Continue to watch diet - Bayer DCA Hb A1c Waived  4. BMI 32.0-32.9,adult Discussed diet and exercise for person with BMI >25 Will recheck weight in 3-6 months  Meds ordered this encounter  Medications  . rosuvastatin (CRESTOR) 10 MG tablet    Sig: Take 1 tablet (10 mg total) by mouth daily.    Dispense:  90 tablet    Refill:  1    Order Specific Question:   Supervising Provider    Answer:   Caryl Pina A [id number]  . lisinopril-hydrochlorothiazide (ZESTORETIC) 10-12.5 MG tablet    Sig: Take 1 tablet by mouth daily.    Dispense:  90 tablet    Refill:  1    Order Specific Question:   Supervising Provider    Answer:   Caryl Pina A [id number]    Labs pending Health Maintenance reviewed Diet and exercise encouraged  Follow up plan: 6 months   Mary-Margaret Hassell Done, FNP

## 2021-02-12 LAB — LIPID PANEL
Chol/HDL Ratio: 3.5 ratio (ref 0.0–4.4)
Cholesterol, Total: 161 mg/dL (ref 100–199)
HDL: 46 mg/dL (ref >39)
LDL Chol Calc (NIH): 92 mg/dL (ref 0–99)
Triglycerides: 128 mg/dL (ref 0–149)
VLDL Cholesterol Cal: 23 mg/dL (ref 5–40)

## 2021-02-12 LAB — CBC WITH DIFFERENTIAL/PLATELET
Basophils Absolute: 0 10*3/uL (ref 0.0–0.2)
Basos: 0 %
EOS (ABSOLUTE): 0.3 10*3/uL (ref 0.0–0.4)
Eos: 4 %
Hematocrit: 34.2 % (ref 34.0–46.6)
Hemoglobin: 11.3 g/dL (ref 11.1–15.9)
Immature Grans (Abs): 0 10*3/uL (ref 0.0–0.1)
Immature Granulocytes: 0 %
Lymphocytes Absolute: 2.7 10*3/uL (ref 0.7–3.1)
Lymphs: 38 %
MCH: 27.2 pg (ref 26.6–33.0)
MCHC: 33 g/dL (ref 31.5–35.7)
MCV: 82 fL (ref 79–97)
Monocytes Absolute: 0.6 10*3/uL (ref 0.1–0.9)
Monocytes: 8 %
Neutrophils Absolute: 3.6 10*3/uL (ref 1.4–7.0)
Neutrophils: 50 %
Platelets: 342 10*3/uL (ref 150–450)
RBC: 4.15 x10E6/uL (ref 3.77–5.28)
RDW: 13.5 % (ref 11.7–15.4)
WBC: 7.3 10*3/uL (ref 3.4–10.8)

## 2021-02-12 LAB — CMP14+EGFR
ALT: 16 IU/L (ref 0–32)
AST: 16 IU/L (ref 0–40)
Albumin/Globulin Ratio: 1.5 (ref 1.2–2.2)
Albumin: 4.1 g/dL (ref 3.8–4.8)
Alkaline Phosphatase: 78 IU/L (ref 44–121)
BUN/Creatinine Ratio: 20 (ref 12–28)
BUN: 14 mg/dL (ref 8–27)
Bilirubin Total: <0.2 mg/dL (ref 0.0–1.2)
CO2: 22 mmol/L (ref 20–29)
Calcium: 9.3 mg/dL (ref 8.7–10.3)
Chloride: 104 mmol/L (ref 96–106)
Creatinine, Ser: 0.69 mg/dL (ref 0.57–1.00)
Globulin, Total: 2.7 g/dL (ref 1.5–4.5)
Glucose: 85 mg/dL (ref 65–99)
Potassium: 4.4 mmol/L (ref 3.5–5.2)
Sodium: 142 mmol/L (ref 134–144)
Total Protein: 6.8 g/dL (ref 6.0–8.5)
eGFR: 94 mL/min/{1.73_m2} (ref >59)

## 2021-02-20 DIAGNOSIS — L304 Erythema intertrigo: Secondary | ICD-10-CM | POA: Diagnosis not present

## 2021-02-28 ENCOUNTER — Ambulatory Visit (INDEPENDENT_AMBULATORY_CARE_PROVIDER_SITE_OTHER): Payer: Medicare Other

## 2021-02-28 DIAGNOSIS — Z0000 Encounter for general adult medical examination without abnormal findings: Secondary | ICD-10-CM

## 2021-02-28 NOTE — Progress Notes (Signed)
MEDICARE ANNUAL WELLNESS VISIT  02/28/2021  Telephone Visit Disclaimer This Medicare AWV was conducted by telephone due to national recommendations for restrictions regarding the COVID-19 Pandemic (e.g. social distancing).  I verified, using two identifiers, that I am speaking with Debra Patton or their authorized healthcare agent. I discussed the limitations, risks, security, and privacy concerns of performing an evaluation and management service by telephone and the potential availability of an in-person appointment in the future. The patient expressed understanding and agreed to proceed.  Location of Patient: Home Location of Provider (nurse):  [facility]  Subjective:    Debra Patton is a 70 y.o. female patient of Bennie Pierini, FNP who had a Medicare Annual Wellness Visit today via telephone. Debra Patton is Working part time and lives alone. she has one child. she reports that she is socially active and does interact with friends/family regularly. she is minimally physically active and enjoys.  Patient Care Team: Bennie Pierini, FNP as PCP - General (Family Medicine)  Advanced Directives 02/28/2021 04/28/2020 05/01/2019 04/28/2018  Does Patient Have a Medical Advance Directive? No No No No  Would patient like information on creating a medical advance directive? No - Patient declined No - Patient declined No - Patient declined Yes (MAU/Ambulatory/Procedural Areas - Information given)    Hospital Utilization Over the Past 12 Months: # of hospitalizations or ER visits: 0 # of surgeries: 0  Review of Systems    Patient reports that her overall health is unchanged compared to last year.  Patient Reported Readings (BP, Pulse, CBG, Weight, etc) none  Pain Assessment Pain : No/denies pain     Current Medications & Allergies (verified) Allergies as of 02/28/2021      Reactions   Other Anaphylaxis   Ivp Dye [iodinated Diagnostic Agents]        Medication List       Accurate as of February 28, 2021  3:32 PM. If you have any questions, ask your nurse or doctor.        acetaminophen 500 MG tablet Commonly known as: TYLENOL Take 500 mg by mouth every 6 (six) hours as needed.   aspirin-sod bicarb-citric acid 325 MG Tbef tablet Commonly known as: ALKA-SELTZER Take 325 mg by mouth every 6 (six) hours as needed.   cetirizine 10 MG tablet Commonly known as: ZYRTEC Take 1 tablet (10 mg total) by mouth daily.   Clobetasol Prop Emollient Base 0.05 % emollient cream Commonly known as: Clobetasol Propionate E APPLY TO AFFECTED AREA TWICE A DAY   diphenhydramine-acetaminophen 25-500 MG Tabs tablet Commonly known as: TYLENOL PM Take 1 tablet by mouth at bedtime as needed.   ibuprofen 800 MG tablet Commonly known as: ADVIL Take 1 tablet (800 mg total) by mouth 3 (three) times daily.   lisinopril-hydrochlorothiazide 10-12.5 MG tablet Commonly known as: ZESTORETIC Take 1 tablet by mouth daily.   meloxicam 7.5 MG tablet Commonly known as: MOBIC TAKE 1 TABLET BY MOUTH EVERY DAY   rosuvastatin 10 MG tablet Commonly known as: Crestor Take 1 tablet (10 mg total) by mouth daily.       History (reviewed): Past Medical History:  Diagnosis Date  . Allergy   . Diabetes mellitus without complication ([facility])   . Glaucoma   . History of acute pancreatitis   . History of herpes zoster   . Hyperlipidemia   . Hypertension    Past Surgical History:  Procedure Laterality Date  . CARPAL TUNNEL RELEASE    .  CHOLECYSTECTOMY    . NECK SURGERY    . TUBAL LIGATION    . TUMOR REMOVAL Left 2016   Neck    Family History  Problem Relation Age of Onset  . Hypertension Mother   . Diabetes Mother   . Stroke Mother   . Vision loss Mother   . Asthma Mother   . Alcohol abuse Father   . Cancer Father   . Stroke Father   . Hypertension Sister   . Early death Brother   . Cancer Brother        Brain tumor   . Diabetes Sister   .  Hypertension Sister   . Cancer Brother   . Early death Brother   . Diabetes Brother   . Hypertension Brother   . Hypertension Brother   . Cancer Brother   . Diabetes Brother   . Hypertension Brother   . Cancer Brother   . Diabetes Brother   . Hypertension Brother   . Vision loss Brother   . Stroke Brother    Social History   Socioeconomic History  . Marital status: Single    Spouse name: Not on file  . Number of children: 2  . Years of education: GED  . Highest education level: GED or equivalent  Occupational History  . 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 Smoking status: Former Smoker    Quit date: 11/09/2004    Years since quitting: 16.3  . Smokeless tobacco: Never Used  Vaping Use  . Vaping Use: Never used  Substance and Sexual Activity  . Alcohol use: Not Currently  . Drug use: Never  . Sexual activity: Not Currently  Other Topics Concern  . Not on file  Social History Narrative  . Not on file   Social Determinants of Health   Financial Resource Strain: Not on file  Food Insecurity: Not on file  Transportation Needs: Not on file  Physical Activity: Not on file  Stress: Not on file  Social Connections: Not on file    Activities of Daily Living In your present state of health, do you have any difficulty performing the following activities: 02/28/2021 08/12/2020  Hearing? N N  Vision? N N  Difficulty concentrating or making decisions? N N  Walking or climbing stairs? N N  Dressing or bathing? N N  Doing errands, shopping? N N  Preparing Food and eating ? N -  Using the Toilet? N -  In the past six months, have you accidently leaked urine? N -  Do you have problems with loss of bowel control? N -  Managing your Medications? N -  Managing your Finances? N -  Housekeeping or managing your Housekeeping? N -  Some recent data might be hidden    Patient Education/ Literacy How often do you need to have someone help you  when you read instructions, pamphlets, or other written materials from your doctor or pharmacy?: 1 - Never What is the last grade level you completed in school?: GED  Exercise Current Exercise Habits: The patient does not participate in regular exercise at present, Exercise limited by: None identified  Diet Patient reports consuming 2 meals a day and 1 snack(s) a day Patient reports that her primary diet is: Regular Patient reports that she does have regular access to food.   Depression Screen PHQ 2/9 Scores 02/28/2021 02/11/2021 08/12/2020 04/05/2020 01/22/2020 07/10/2019 05/01/2019  PHQ - 2 Score 0 0  0 0 0 0 0  Exception Documentation - - - - - - -  Not completed - - - - - - -     Fall Risk Fall Risk  02/28/2021 02/11/2021 08/12/2020 04/05/2020 01/22/2020  Falls in the past year? 1 0 0 0 0  Number falls in past yr: 0 - - - -  Injury with Fall? 0 - - - -  Risk for fall due to : Impaired balance/gait - - - -  Follow up Falls evaluation completed - - - -     Objective:  Debra Patton seemed alert and oriented and she participated appropriately during our telephone visit.  Blood Pressure Weight BMI  BP Readings from Last 3 Encounters:  02/11/21 124/68  01/08/21 129/67  08/12/20 (!) 145/69   Wt Readings from Last 3 Encounters:  02/11/21 188 lb (85.3 kg)  01/08/21 187 lb (84.8 kg)  08/12/20 179 lb (81.2 kg)   BMI Readings from Last 1 Encounters:  02/11/21 34.39 kg/m    *Unable to obtain current vital signs, weight, and BMI due to telephone visit type  Hearing/Vision  . Debra Patton did not seem to have difficulty with hearing/understanding during the telephone conversation . Reports that she has had a formal eye exam by an eye care professional within the past year . Reports that she has not had a formal hearing evaluation within the past year *Unable to fully assess hearing and vision during telephone visit type  Cognitive Function: 6CIT Screen 02/28/2021 02/28/2021 05/01/2019  What  Year? - 0 points 0 points  What month? - 0 points 0 points  What time? - 0 points 0 points  Count back from 20 - 0 points 0 points  Months in reverse - 0 points 0 points  Repeat phrase 2 points 2 points 2 points  Total Score - 2 2   (Normal:0-7, Significant for Dysfunction: >8)  Normal Cognitive Function Screening: Yes   Immunization & Health Maintenance Record Immunization History  Administered Date(s) Administered  . Moderna Sars-Covid-2 Vaccination 06/02/2020, 06/30/2020  . Tdap 04/27/2017    Health Maintenance  Topic Date Due  . PNA vac Low Risk Adult (1 of 2 - PCV13) 08/12/2021 (Originally 10/31/2017)  . OPHTHALMOLOGY EXAM  06/04/2021  . INFLUENZA VACCINE  06/09/2021  . FOOT EXAM  08/12/2021  . HEMOGLOBIN A1C  08/13/2021  . MAMMOGRAM  08/29/2022  . COLONOSCOPY (Pts 45-69yrs Insurance coverage will need to be confirmed)  03/02/2024  . TETANUS/TDAP  04/28/2027  . DEXA SCAN  Completed  . Hepatitis C Screening  Completed  . HPV VACCINES  Aged Out  . COVID-19 Vaccine  Discontinued       Assessment  This is a routine wellness examination for Debra Patton.  Health Maintenance: Due or Overdue There are no preventive care reminders to display for this patient.  Debra Patton does not need a referral for Community Assistance: Care Management:   no Social Work:    no Prescription Assistance:  no Nutrition/Diabetes Education:  no   Plan:  Personalized Goals Goals Addressed            This Visit's Progress   . Have 3 meals a day   No change   . Increase physical activity   On track     Personalized Health Maintenance & Screening Recommendations  Shingles and Pneumonia Vaccine  Lung Cancer Screening Recommended: no (Low Dose CT Chest recommended if Age 15-80 years, 30 pack-year currently smoking OR  have quit w/in past 15 years) Hepatitis C Screening recommended: no HIV Screening recommended: no  Advanced Directives: Written information was not  prepared per patient's request.  Referrals & Orders No orders of the defined types were placed in this encounter.   Follow-up Plan . Follow-up with Bennie PieriniMartin, Mary-Margaret, FNP as planned . Schedule 08/25/21   I have personally reviewed and noted the following in the patient's chart:   . Medical and social history . Use of alcohol, tobacco or illicit drugs  . Current medications and supplements . Functional ability and status . Nutritional status . Physical activity . Advanced directives . List of other physicians . Hospitalizations, surgeries, and ER visits in previous 12 months . Vitals . Screenings to include cognitive, depression, and falls . Referrals and appointments  In addition, I have reviewed and discussed with Debra Patton certain preventive protocols, quality metrics, and best practice recommendations. A written personalized care plan for preventive services as well as general preventive health recommendations is available and can be mailed to the patient at her request.      Suzan Slickshley N [facility],LPN  1/61/09604/22/2022

## 2021-02-28 NOTE — Patient Instructions (Addendum)
  MEDICARE ANNUAL WELLNESS VISIT Health Maintenance Summary and Written Plan of Care  Debra Patton ,  Thank you for allowing me to perform your Medicare Annual Wellness Visit and for your ongoing commitment to your health.   Health Maintenance & Immunization History Health Maintenance  Topic Date Due  . PNA vac Low Risk Adult (1 of 2 - PCV13) 08/12/2021 (Originally 10/31/2017)  . OPHTHALMOLOGY EXAM  06/04/2021  . INFLUENZA VACCINE  06/09/2021  . FOOT EXAM  08/12/2021  . HEMOGLOBIN A1C  08/13/2021  . MAMMOGRAM  08/29/2022  . COLONOSCOPY (Pts 45-63yrs Insurance coverage will need to be confirmed)  03/02/2024  . TETANUS/TDAP  04/28/2027  . DEXA SCAN  Completed  . Hepatitis C Screening  Completed  . HPV VACCINES  Aged Out  . COVID-19 Vaccine  Discontinued   Immunization History  Administered Date(s) Administered  . Moderna Sars-Covid-2 Vaccination 06/02/2020, 06/30/2020  . Tdap 04/27/2017    These are the patient goals that we discussed: Goals Addressed            This Visit's Progress   . Have 3 meals a day   No change   . Increase physical activity   On track           This is a list of Health Maintenance Items that are overdue or due now: There are no preventive care reminders to display for this patient.   Orders/Referrals Placed Today: No orders of the defined types were placed in this encounter.  (Contact our referral department at [phone] if you have not spoken with someone about your referral appointment within the next 5 days)    Follow-up Plan  Scheduled with Gennette Pac 08/25/21 2:15pm

## 2021-03-07 ENCOUNTER — Other Ambulatory Visit: Payer: Self-pay | Admitting: Nurse Practitioner

## 2021-03-07 DIAGNOSIS — E785 Hyperlipidemia, unspecified: Secondary | ICD-10-CM

## 2021-03-07 DIAGNOSIS — E1169 Type 2 diabetes mellitus with other specified complication: Secondary | ICD-10-CM

## 2021-04-30 ENCOUNTER — Other Ambulatory Visit: Payer: Self-pay | Admitting: Nurse Practitioner

## 2021-04-30 DIAGNOSIS — E119 Type 2 diabetes mellitus without complications: Secondary | ICD-10-CM

## 2021-07-08 ENCOUNTER — Ambulatory Visit (INDEPENDENT_AMBULATORY_CARE_PROVIDER_SITE_OTHER): Payer: Medicare Other | Admitting: Nurse Practitioner

## 2021-07-08 ENCOUNTER — Other Ambulatory Visit: Payer: Self-pay

## 2021-07-08 ENCOUNTER — Encounter: Payer: Self-pay | Admitting: Nurse Practitioner

## 2021-07-08 VITALS — BP 125/67 | HR 71 | Temp 97.5°F | Resp 20 | Ht 62.0 in | Wt 183.0 lb

## 2021-07-08 DIAGNOSIS — L819 Disorder of pigmentation, unspecified: Secondary | ICD-10-CM | POA: Diagnosis not present

## 2021-07-08 NOTE — Progress Notes (Signed)
   Subjective:    Patient ID: Debra Patton, female    DOB: [birth year], 70 y.o.   MRN: [id number]  Chief Complaint: Dark spots on vagina (On outside and inside)   HPI Patient comes in because she is concerned about a dark spots in vaginal area. She first noticed it about 1 week ago. Has no smell, no itching. Has stayed the same since she found them. She has a history of lichen sclerosus. She has used cream and that resolved issue.    Review of Systems  Constitutional:  Negative for diaphoresis.  Eyes:  Negative for pain.  Respiratory:  Negative for shortness of breath.   Cardiovascular:  Negative for chest pain, palpitations and leg swelling.  Gastrointestinal:  Negative for abdominal pain.  Endocrine: Negative for polydipsia.  Genitourinary:  Negative for frequency, vaginal bleeding, vaginal discharge and vaginal pain.  Skin:  Negative for rash.  Neurological:  Negative for dizziness, weakness and headaches.  Hematological:  Does not bruise/bleed easily.  All other systems reviewed and are negative.     Objective:   Physical Exam Vitals reviewed.  Constitutional:      Appearance: Normal appearance.  Cardiovascular:     Rate and Rhythm: Normal rate and regular rhythm.     Heart sounds: Normal heart sounds.  Pulmonary:     Effort: Pulmonary effort is normal.     Breath sounds: Normal breath sounds.  Genitourinary:    Comments: Black macular spot of varying size on labia majoria and minorbil Adhesions of labia bil. Neurological:     Mental Status: She is alert.    BP 125/67   Pulse 71   Temp (!) 97.5 F (36.4 C) (Temporal)   Resp 20   Ht 5\' 2"  (1.575 m)   Wt 183 lb (83 kg)   SpO2 95%   BMI 33.47 kg/m        Assessment & Plan:   in today with chief complaint of Dark spots on vagina (On outside and inside)   1. Hyperpigmentation of vulva Needs  to be bx to make sure nothing to worry about. Orders Placed This Encounter  Procedures    Ambulatory referral to Gynecology    Referral Priority:   Routine    Referral Type:   Consultation    Referral Reason:   Specialty Services Required    Requested Specialty:   Gynecology    Number of Visits Requested:   1      The above assessment and management plan was discussed with the patient. The patient verbalized understanding of and has agreed to the management plan. Patient is aware to call the clinic if symptoms persist or worsen. Patient is aware when to return to the clinic for a follow-up visit. Patient educated on when it is appropriate to go to the emergency department.   Mary-Margaret [name], FNP

## 2021-08-01 ENCOUNTER — Other Ambulatory Visit: Payer: Self-pay | Admitting: Nurse Practitioner

## 2021-08-01 DIAGNOSIS — E119 Type 2 diabetes mellitus without complications: Secondary | ICD-10-CM

## 2021-08-07 ENCOUNTER — Institutional Professional Consult (permissible substitution) (HOSPITAL_BASED_OUTPATIENT_CLINIC_OR_DEPARTMENT_OTHER): Payer: Medicare Other | Admitting: Obstetrics & Gynecology

## 2021-08-10 ENCOUNTER — Other Ambulatory Visit: Payer: Self-pay | Admitting: Nurse Practitioner

## 2021-08-10 DIAGNOSIS — I10 Essential (primary) hypertension: Secondary | ICD-10-CM

## 2021-08-25 ENCOUNTER — Other Ambulatory Visit: Payer: Self-pay

## 2021-08-25 ENCOUNTER — Encounter: Payer: Self-pay | Admitting: Nurse Practitioner

## 2021-08-25 ENCOUNTER — Ambulatory Visit (INDEPENDENT_AMBULATORY_CARE_PROVIDER_SITE_OTHER): Payer: Medicare Other | Admitting: Nurse Practitioner

## 2021-08-25 ENCOUNTER — Other Ambulatory Visit: Payer: Self-pay | Admitting: Nurse Practitioner

## 2021-08-25 VITALS — BP 124/67 | HR 65 | Temp 97.3°F | Resp 20 | Ht 62.0 in | Wt 183.0 lb

## 2021-08-25 DIAGNOSIS — L900 Lichen sclerosus et atrophicus: Secondary | ICD-10-CM | POA: Diagnosis not present

## 2021-08-25 DIAGNOSIS — E119 Type 2 diabetes mellitus without complications: Secondary | ICD-10-CM | POA: Diagnosis not present

## 2021-08-25 DIAGNOSIS — Z6832 Body mass index (BMI) 32.0-32.9, adult: Secondary | ICD-10-CM

## 2021-08-25 DIAGNOSIS — E785 Hyperlipidemia, unspecified: Secondary | ICD-10-CM | POA: Diagnosis not present

## 2021-08-25 DIAGNOSIS — I10 Essential (primary) hypertension: Secondary | ICD-10-CM | POA: Diagnosis not present

## 2021-08-25 DIAGNOSIS — E1169 Type 2 diabetes mellitus with other specified complication: Secondary | ICD-10-CM

## 2021-08-25 MED ORDER — LISINOPRIL-HYDROCHLOROTHIAZIDE 10-12.5 MG PO TABS: 1.0000 | ORAL_TABLET | Freq: Every day | ORAL | 1 refills | Status: DC

## 2021-08-25 MED ORDER — ROSUVASTATIN CALCIUM 10 MG PO TABS: 10.0000 mg | ORAL_TABLET | Freq: Every day | ORAL | 1 refills | Status: DC

## 2021-08-25 MED ORDER — KETOCONAZOLE 2 % EX CREA: TOPICAL_CREAM | Freq: Two times a day (BID) | CUTANEOUS | 2 refills | Status: DC

## 2021-08-25 NOTE — Progress Notes (Signed)
Subjective:    Patient ID: Debra Patton, female    DOB: 1951-10-13, 70 y.o.   MRN: [id number]  Chief Complaint: medical management of chronic issues     HPI:  1. Essential hypertension No c/o chest pain, sob or headache. Does not check blood pressure at home. BP Readings from Last 3 Encounters:  07/08/21 125/67  02/11/21 124/68  01/08/21 129/67     2. Hyperlipidemia associated with type 2 diabetes mellitus ([place]) Does try to watch diet and does very little exercise. Lab Results  Component Value Date   CHOL 161 02/11/2021   HDL 46 02/11/2021   LDLCALC 92 02/11/2021   TRIG 128 02/11/2021   CHOLHDL 3.5 02/11/2021     3. Diabetes mellitus without complication ([place]) She has not been checking her blood sugars at home. She tries to watch diet. Lab Results  Component Value Date   HGBA1C 5.8 02/11/2021     4. Lichen sclerosus Has itching but clobetasol cream helps,. Dermatologist gave her some combination cream that worked better. We will try to contact [facility] about it.  5. BMI 32.0-32.9,adult No recent weight changes  Wt Readings from Last 3 Encounters:  08/25/21 183 lb (83 kg)  07/08/21 183 lb (83 kg)  02/11/21 188 lb (85.3 kg)   BMI Readings from Last 3 Encounters:  08/25/21 33.47 kg/m  07/08/21 33.47 kg/m  02/11/21 34.39 kg/m     Outpatient Encounter Medications as of 08/25/2021  Medication Sig   acetaminophen (TYLENOL) 500 MG tablet Take 500 mg by mouth every 6 (six) hours as needed.   aspirin-sod bicarb-citric acid (ALKA-SELTZER) 325 MG TBEF tablet Take 325 mg by mouth every 6 (six) hours as needed.   cetirizine (ZYRTEC) 10 MG tablet Take 1 tablet (10 mg total) by mouth daily.   Clobetasol Prop Emollient Base (CLOBETASOL PROPIONATE E) 0.05 % emollient cream APPLY TO AFFECTED AREA TWICE A DAY   diphenhydramine-acetaminophen (TYLENOL PM) 25-500 MG TABS tablet Take 1 tablet by mouth at bedtime as needed.   ibuprofen (ADVIL) 800 MG tablet Take 1  tablet (800 mg total) by mouth 3 (three) times daily.   lisinopril-hydrochlorothiazide (ZESTORETIC) 10-12.5 MG tablet TAKE 1 TABLET BY MOUTH EVERY DAY   meloxicam (MOBIC) 7.5 MG tablet TAKE 1 TABLET BY MOUTH EVERY DAY   rosuvastatin (CRESTOR) 10 MG tablet TAKE 1 TABLET BY MOUTH EVERY DAY   No facility-administered encounter medications on file as of 08/25/2021.    Past Surgical History:  Procedure Laterality Date   CARPAL TUNNEL RELEASE     CHOLECYSTECTOMY     NECK SURGERY     TUBAL LIGATION     TUMOR REMOVAL Left 2016   Neck     Family History  Problem Relation Age of Onset   Hypertension Mother    Diabetes Mother    Stroke Mother    Vision loss Mother    Asthma Mother    Alcohol abuse Father    Cancer Father    Stroke Father    Hypertension Sister    Early death Brother    Cancer Brother        Brain tumor    Diabetes Sister    Hypertension Sister    Cancer Brother    Early death Brother    Diabetes Brother    Hypertension Brother    Hypertension Brother    Cancer Brother    Diabetes Brother    Hypertension Brother    Cancer Brother  Diabetes Brother    Hypertension Brother    Vision loss Brother    Stroke Brother     New complaints: None today  Social history: Lives by herself  Controlled substance contract: n/a     Review of Systems  Constitutional:  Negative for diaphoresis.  Eyes:  Negative for pain.  Respiratory:  Negative for shortness of breath.   Cardiovascular:  Negative for chest pain, palpitations and leg swelling.  Gastrointestinal:  Negative for abdominal pain.  Endocrine: Negative for polydipsia.  Skin:  Negative for rash.  Neurological:  Negative for dizziness, weakness and headaches.  Hematological:  Does not bruise/bleed easily.  All other systems reviewed and are negative.     Objective:   Physical Exam Vitals and nursing note reviewed.  Constitutional:      General: She is not in acute distress.    Appearance: Normal  appearance. She is well-developed.  HENT:     Head: Normocephalic.     Right Ear: Tympanic membrane normal.     Left Ear: Tympanic membrane normal.     Nose: Nose normal.     Mouth/Throat:     Mouth: Mucous membranes are moist.  Eyes:     Pupils: Pupils are equal, round, and reactive to light.  Neck:     Vascular: No carotid bruit or JVD.  Cardiovascular:     Rate and Rhythm: Normal rate and regular rhythm.     Heart sounds: Normal heart sounds.  Pulmonary:     Effort: Pulmonary effort is normal. No respiratory distress.     Breath sounds: Normal breath sounds. No wheezing or rales.  Chest:     Chest wall: No tenderness.  Abdominal:     General: Bowel sounds are normal. There is no distension or abdominal bruit.     Palpations: Abdomen is soft. There is no hepatomegaly, splenomegaly, mass or pulsatile mass.     Tenderness: There is no abdominal tenderness.  Musculoskeletal:        General: Normal range of motion.     Cervical back: Normal range of motion and neck supple.  Lymphadenopathy:     Cervical: No cervical adenopathy.  Skin:    General: Skin is warm and dry.  Neurological:     Mental Status: She is alert and oriented to person, place, and time.     Deep Tendon Reflexes: Reflexes are normal and symmetric.  Psychiatric:        Behavior: Behavior normal.        Thought Content: Thought content normal.        Judgment: Judgment normal.    BP 124/67   Pulse 65   Temp (!) 97.3 F (36.3 C) (Temporal)   Resp 20   Ht 5' 2"  (1.575 m)   Wt 183 lb (83 kg)   BMI 33.47 kg/m   Hgba1c 57%     Assessment & Plan:  Debra Patton comes in today with chief complaint of Medical Management of Chronic Issues   Diagnosis and orders addressed:  1. Essential hypertension Low sodium diet - CBC with Differential/Platelet - CMP14+EGFR - lisinopril-hydrochlorothiazide (ZESTORETIC) 10-12.5 MG tablet; Take 1 tablet by mouth daily.  Dispense: 90 tablet; Refill: 1  2.  Hyperlipidemia associated with type 2 diabetes mellitus ([facility]) Low far diet - Lipid panel - rosuvastatin (CRESTOR) 10 MG tablet; Take 1 tablet (10 mg total) by mouth daily.  Dispense: 90 tablet; Refill: 1  3. Diabetes mellitus without complication ([facility]) Continue to bwatch  diet - Bayer DCA Hb A1c Waived - Microalbumin / creatinine urine ratio  4. Lichen sclerosus Apply cream bid No harsh soaps - fluticasone 0.05%-ketoconazole 2% 1:1 cream mixture; Apply topically 2 (two) times daily.  Dispense: 120 g; Refill: 2  5. BMI 32.0-32.9,adult Discussed diet and exercise for person with BMI >25 Will recheck weight in 3-6 months    Labs pending Health Maintenance reviewed Diet and exercise encouraged  Follow up plan: 6 months   Mary-Margaret Hassell Done, FNP

## 2021-08-25 NOTE — Patient Instructions (Signed)
Recombinant Zoster (Shingles) Vaccine: What You Need to Know 1. Why get vaccinated? Recombinant zoster (shingles) vaccine can prevent shingles. Shingles (also called herpes zoster, or just zoster) is a painful skin rash, usually with blisters. In addition to the rash, shingles can cause fever, headache, chills, or upset stomach. Rarely, shingles can lead to complications such as pneumonia, hearing problems, blindness, brain inflammation (encephalitis), or death. The risk of shingles increases with age. The most common complication of shingles is long-term nerve pain called postherpetic neuralgia (PHN). PHN occurs in the areas where the shingles rash was and can last for months or years after the rash goes away. The pain from PHN can be severe and debilitating. The risk of PHN increases with age. An older adult with shingles is more likely to develop PHN and have longer lasting and more severe pain than a younger person. People with weakened immune systems also have a higher risk of getting shingles and complications from the disease. Shingles is caused by varicella-zoster virus, the same virus that causes chickenpox. After you have chickenpox, the virus stays in your body and can cause shingles later in life. Shingles cannot be passed from one person to another, but the virus that causes shingles can spread and cause chickenpox in someone who has never had chickenpox or has never received chickenpox vaccine. 2. Recombinant shingles vaccine Recombinant shingles vaccine provides strong protection against shingles. By preventing shingles, recombinant shingles vaccine also protects against PHN and other complications. Recombinant shingles vaccine is recommended for: Adults 50 years and older Adults 19 years and older who have a weakened immune system because of disease or treatments Shingles vaccine is given as a two-dose series. For most people, the second dose should be given 2 to 6 months after the first  dose. Some people who have or will have a weakened immune system can get the second dose 1 to 2 months after the first dose. Ask your health care provider for guidance. People who have had shingles in the past and people who have received varicella (chickenpox) vaccine are recommended to get recombinant shingles vaccine. The vaccine is also recommended for people who have already gotten another type of shingles vaccine, the live shingles vaccine. There is no live virus in recombinant shingles vaccine. Shingles vaccine may be given at the same time as other vaccines. 3. Talk with your health care provider Tell your vaccination provider if the person getting the vaccine: Has had an allergic reaction after a previous dose of recombinant shingles vaccine, or has any severe, life-threatening allergies Is currently experiencing an episode of shingles Is pregnant In some cases, your health care provider may decide to postpone shingles vaccination until a future visit. People with minor illnesses, such as a cold, may be vaccinated. People who are moderately or severely ill should usually wait until they recover before getting recombinant shingles vaccine. Your health care provider can give you more information. 4. Risks of a vaccine reaction A sore arm with mild or moderate pain is very common after recombinant shingles vaccine. Redness and swelling can also happen at the site of the injection. Tiredness, muscle pain, headache, shivering, fever, stomach pain, and nausea are common after recombinant shingles vaccine. These side effects may temporarily prevent a vaccinated person from doing regular activities. Symptoms usually go away on their own in 2 to 3 days. You should still get the second dose of recombinant shingles vaccine even if you had one of these reactions after the first dose. Guillain-Barr   syndrome (GBS), a serious nervous system disorder, has been reported very rarely after recombinant zoster  vaccine. People sometimes faint after medical procedures, including vaccination. Tell your provider if you feel dizzy or have vision changes or ringing in the ears. As with any medicine, there is a very remote chance of a vaccine causing a severe allergic reaction, other serious injury, or death. 5. What if there is a serious problem? An allergic reaction could occur after the vaccinated person leaves the clinic. If you see signs of a severe allergic reaction (hives, swelling of the face and throat, difficulty breathing, a fast heartbeat, dizziness, or weakness), call 9-1-1 and get the person to the nearest hospital. For other signs that concern you, call your health care provider. Adverse reactions should be reported to the Vaccine Adverse Event Reporting System (VAERS). Your health care provider will usually file this report, or you can do it yourself. Visit the VAERS website at www.vaers.hhs.gov or call 1-800-822-7967. VAERS is only for reporting reactions, and VAERS staff members do not give medical advice. 6. How can I learn more? Ask your health care provider. Call your local or state health department. Visit the website of the Food and Drug Administration (FDA) for vaccine package inserts and additional information at www.fda.gov/vaccinesblood-biologics/vaccines. Contact the Centers for Disease Control and Prevention (CDC): Call 1-800-232-4636 (1-800-CDC-INFO) or Visit CDC's website at www.cdc.gov/vaccines. Vaccine Information Statement Recombinant Zoster Vaccine (12/13/2020) This information is not intended to replace advice given to you by your health care provider. Make sure you discuss any questions you have with your health care provider. Document Revised: 12/27/2020 Document Reviewed: 12/27/2020 Elsevier Patient Education  2022 Elsevier Inc.  

## 2021-08-26 LAB — CBC WITH DIFFERENTIAL/PLATELET
Basophils Absolute: 0 10*3/uL (ref 0.0–0.2)
Basos: 0 %
EOS (ABSOLUTE): 0.2 10*3/uL (ref 0.0–0.4)
Eos: 4 %
Hematocrit: 36.7 % (ref 34.0–46.6)
Hemoglobin: 11.8 g/dL (ref 11.1–15.9)
Immature Grans (Abs): 0 10*3/uL (ref 0.0–0.1)
Immature Granulocytes: 0 %
Lymphocytes Absolute: 2.4 10*3/uL (ref 0.7–3.1)
Lymphs: 37 %
MCH: 26.9 pg (ref 26.6–33.0)
MCHC: 32.2 g/dL (ref 31.5–35.7)
MCV: 84 fL (ref 79–97)
Monocytes Absolute: 0.5 10*3/uL (ref 0.1–0.9)
Monocytes: 8 %
Neutrophils Absolute: 3.3 10*3/uL (ref 1.4–7.0)
Neutrophils: 51 %
Platelets: 352 10*3/uL (ref 150–450)
RBC: 4.39 x10E6/uL (ref 3.77–5.28)
RDW: 13.6 % (ref 11.7–15.4)
WBC: 6.4 10*3/uL (ref 3.4–10.8)

## 2021-08-26 LAB — CMP14+EGFR
ALT: 13 IU/L (ref 0–32)
AST: 11 IU/L (ref 0–40)
Albumin/Globulin Ratio: 1.8 (ref 1.2–2.2)
Albumin: 4.6 g/dL (ref 3.8–4.8)
Alkaline Phosphatase: 88 IU/L (ref 44–121)
BUN/Creatinine Ratio: 19 (ref 12–28)
BUN: 16 mg/dL (ref 8–27)
Bilirubin Total: 0.3 mg/dL (ref 0.0–1.2)
CO2: 24 mmol/L (ref 20–29)
Calcium: 9.8 mg/dL (ref 8.7–10.3)
Chloride: 102 mmol/L (ref 96–106)
Creatinine, Ser: 0.83 mg/dL (ref 0.57–1.00)
Globulin, Total: 2.6 g/dL (ref 1.5–4.5)
Glucose: 74 mg/dL (ref 70–99)
Potassium: 4.2 mmol/L (ref 3.5–5.2)
Sodium: 140 mmol/L (ref 134–144)
Total Protein: 7.2 g/dL (ref 6.0–8.5)
eGFR: 77 mL/min/{1.73_m2} (ref >59)

## 2021-08-26 LAB — LIPID PANEL
Chol/HDL Ratio: 3.2 ratio (ref 0.0–4.4)
Cholesterol, Total: 174 mg/dL (ref 100–199)
HDL: 54 mg/dL (ref >39)
LDL Chol Calc (NIH): 105 mg/dL — ABNORMAL HIGH (ref 0–99)
Triglycerides: 83 mg/dL (ref 0–149)
VLDL Cholesterol Cal: 15 mg/dL (ref 5–40)

## 2021-08-26 LAB — MICROALBUMIN / CREATININE URINE RATIO
Creatinine, Urine: 165 mg/dL
Microalb/Creat Ratio: <2 mg/g creat (ref 0–29)
Microalbumin, Urine: <3 ug/mL

## 2021-08-26 LAB — BAYER DCA HB A1C WAIVED: HB A1C (BAYER DCA - WAIVED): 5.7 % — ABNORMAL HIGH (ref 4.8–5.6)

## 2021-08-26 NOTE — Telephone Encounter (Signed)
What is covered- that is what she got last time.

## 2021-08-26 NOTE — Telephone Encounter (Signed)
Under history in chart I saw that pt has gotten Clobetasol emollient cream 15g in the past, pharmacy says that this would cost her only $3, they would have to order it for her Please advise if this is an appropriate alternative

## 2021-08-26 NOTE — Telephone Encounter (Signed)
Please call [facility] and have them send [place] request for the cream she got last time and we will try refilling it.

## 2021-08-27 MED ORDER — CLOBETASOL PROP EMOLLIENT BASE 0.05 % EX CREA: TOPICAL_CREAM | CUTANEOUS | 1 refills | Status: DC

## 2021-09-12 ENCOUNTER — Ambulatory Visit (INDEPENDENT_AMBULATORY_CARE_PROVIDER_SITE_OTHER): Payer: Medicare Other

## 2021-09-12 ENCOUNTER — Other Ambulatory Visit: Payer: Self-pay

## 2021-09-12 DIAGNOSIS — Z23 Encounter for immunization: Secondary | ICD-10-CM

## 2021-09-26 ENCOUNTER — Ambulatory Visit (INDEPENDENT_AMBULATORY_CARE_PROVIDER_SITE_OTHER): Payer: Medicare Other | Admitting: *Deleted

## 2021-09-26 ENCOUNTER — Other Ambulatory Visit: Payer: Self-pay

## 2021-09-26 DIAGNOSIS — Z23 Encounter for immunization: Secondary | ICD-10-CM

## 2021-11-12 ENCOUNTER — Other Ambulatory Visit (HOSPITAL_COMMUNITY)
Admission: RE | Admit: 2021-11-12 | Discharge: 2021-11-12 | Disposition: A | Discharge status: Home / Self Care | Payer: Medicare Other | Source: Ambulatory Visit | Attending: Obstetrics & Gynecology | Admitting: Obstetrics & Gynecology

## 2021-11-12 ENCOUNTER — Ambulatory Visit (INDEPENDENT_AMBULATORY_CARE_PROVIDER_SITE_OTHER): Payer: Medicare Other | Admitting: Obstetrics & Gynecology

## 2021-11-12 ENCOUNTER — Encounter (HOSPITAL_BASED_OUTPATIENT_CLINIC_OR_DEPARTMENT_OTHER): Payer: Self-pay | Admitting: Obstetrics & Gynecology

## 2021-11-12 ENCOUNTER — Other Ambulatory Visit: Payer: Self-pay

## 2021-11-12 VITALS — BP 150/58 | HR 62 | Ht 63.0 in | Wt 183.8 lb

## 2021-11-12 DIAGNOSIS — L810 Postinflammatory hyperpigmentation: Secondary | ICD-10-CM | POA: Diagnosis not present

## 2021-11-12 DIAGNOSIS — L989 Disorder of the skin and subcutaneous tissue, unspecified: Secondary | ICD-10-CM

## 2021-11-12 DIAGNOSIS — L900 Lichen sclerosus et atrophicus: Secondary | ICD-10-CM

## 2021-11-12 DIAGNOSIS — N9089 Other specified noninflammatory disorders of vulva and perineum: Secondary | ICD-10-CM

## 2021-11-12 MED ORDER — MOMETASONE FUROATE 0.1 % EX OINT: TOPICAL_OINTMENT | CUTANEOUS | 3 refills | Status: DC

## 2021-11-12 NOTE — Patient Instructions (Signed)
Lichen Sclerosus Lichen sclerosus is a skin problem. It can happen on any part of the body, but it commonly involves the anal and genital areas. It can cause itching and discomfort in these areas. Treatment can help to control symptoms. When the genital area is affected, getting treatment is important because the condition can cause scarring that may lead to other problems if left untreated. What are the causes? The cause of this condition is not known. It may be related to an overactive immune system or a lack of certain hormones. Lichen sclerosus is not an infection or a fungus, and it is not passed from one person to another (non-contagious). What increases the risk? The following factors may make you more likely to develop this condition: You are a woman who has reached menopause. You are a man who was not circumcised. This condition may also develop for the first time in children, usually before they enter puberty. What are the signs or symptoms? Symptoms of this condition include: White areas (plaques) on the skin that may be thin and wrinkled, or thickened. Red and swollen patches (lesions) on the skin. Tears or cracks in the skin. Bruising. Blood blisters. Severe itching. Pain, itching, or burning when urinating. Constipation is also common in children with lichen sclerosus, but can be seen in adults. How is this diagnosed? This condition may be diagnosed with a physical exam. In some cases, a tissue sample may be removed to be checked under a microscope (biopsy). How is this treated? This condition may be treated with: Topical steroids. These are medicated creams or ointments that are applied over the affected areas. Medicines that are taken by mouth. Topical immunotherapy. These are medicated creams or ointments that are applied over the affected areas. They stimulate your immune system to fight the skin condition. This may be used if steroids are not effective. Surgery. This may  be needed in more severe cases that are causing problems such as scarring. Follow these instructions at home: Medicines Take over-the-counter and prescription medicines only as told by your health care provider. Use creams or ointments as told by your health care provider. Skin care Do not scratch the affected areas of skin. If you are a woman, be sure to keep the vaginal area as clean and dry as possible. Clean the affected area of skin gently with water only. Pat skin dry and avoid the use of rough towels or toilet paper. Avoid irritating skin products, including soap and scented lotions. Use emollient creams as directed by your health care provider to help reduce itching. General instructions Keep all follow-up visits. This is important. Your condition may cause constipation. To prevent or treat constipation, you may need to: Drink enough fluid to keep your urine pale yellow. Take over-the-counter or prescription medicines. Eat foods that are high in fiber, such as beans, whole grains, and fresh fruits and vegetables. Limit foods that are high in fat and processed sugars, such as fried or sweet foods. Contact a health care provider if: You have increasing redness, swelling, or pain in the affected area. You have fluid, blood, or pus coming from the affected area. You have new lesions on your skin. You have a fever. You have pain during sex. Get help right away if: You develop severe pain or burning in the affected areas, especially in the genital area. Summary Lichen sclerosus is a skin problem. When the genital area is affected, getting treatment is important because the condition can cause scarring that may   lead to other problems if left untreated. This condition is usually treated with medicated creams or ointments (topical steroids) that are applied over the affected areas. Take or use over-the-counter and prescription medicines only as told by your health care provider. Contact a  health care provider if you have new lesions on your skin, have pain during sex, or have increasing redness, swelling, or pain in the affected area. Keep all follow-up visits. This is important. This information is not intended to replace advice given to you by your health care provider. Make sure you discuss any questions you have with your health care provider. Document Revised: 03/09/2020 Document Reviewed: 03/09/2020 Elsevier Patient Education  2022 [employer] Inc.  

## 2021-11-13 NOTE — Progress Notes (Signed)
GYNECOLOGY  VISIT  CC:   vulvar issues  HPI: 71 y.o. Single Black or [place] American female here for additional evaluation and recommendations regarding vulvar skin issues.  Pt was referred by Bennie Pierini, FNP.  Pt reports having vulvar burning/irritation that has been going on for several years.  Has been diagnosed with lichen sclerosus.  Never had a biopsy.  This summer, noticed some new pigmented lesions and desired evaluation.  Denies pain, discharge or bleeding vaginally or on the vulva.  Does have clobetasol for treatment of lichen sclerosus.  Does not use regularly and only when having symptoms.  Does work for her.  Has to be careful of soaps and toilet papers as these can irritate.  Using fragrance free and dye free products.  GYNECOLOGIC HISTORY: No LMP recorded. Patient is postmenopausal. 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   Lichen sclerosus 04/06/2019   Diabetes mellitus without complication (HCC) 02/23/2018   Essential hypertension 02/23/2018   Hyperlipidemia associated with type 2 diabetes mellitus (HCC) 02/23/2018    Past Medical History:  Diagnosis Date   Allergy    Diabetes mellitus without complication (HCC)    Glaucoma    History of acute pancreatitis    History of herpes zoster    Hyperlipidemia    Hypertension     Past Surgical History:  Procedure Laterality Date   CARPAL TUNNEL RELEASE     CHOLECYSTECTOMY     NECK SURGERY     TUBAL LIGATION     TUMOR REMOVAL Left 2016   Neck     MEDS:   Current Outpatient Medications on File Prior to Visit  Medication Sig Dispense Refill   acetaminophen (TYLENOL) 500 MG tablet Take 500 mg by mouth every 6 (six) hours as needed.     aspirin-sod bicarb-citric acid (ALKA-SELTZER) 325 MG TBEF tablet Take 325 mg by mouth every 6 (six) hours as needed.      cetirizine (ZYRTEC) 10 MG tablet Take 1 tablet (10 mg total) by mouth daily. 30 tablet 5   diphenhydramine-acetaminophen (TYLENOL PM) 25-500 MG TABS tablet Take 1 tablet by mouth at bedtime as needed.     fluticasone 0.05%-ketoconazole 2% 1:1 cream mixture Apply topically 2 (two) times daily. 120 g 2   ibuprofen (ADVIL) 800 MG tablet Take 1 tablet (800 mg total) by mouth 3 (three) times daily. 21 tablet 0   lisinopril-hydrochlorothiazide (ZESTORETIC) 10-12.5 MG tablet Take 1 tablet by mouth daily. 90 tablet 1   meloxicam (MOBIC) 7.5 MG tablet TAKE 1 TABLET BY MOUTH EVERY DAY 90 tablet 0   Clobetasol Prop Emollient Base (CLOBETASOL PROPIONATE E) 0.05 % emollient cream APPLY TO AFFECTED AREA TWICE A DAY (Patient not taking: Reported on 11/12/2021) 15 g 1   rosuvastatin (CRESTOR) 10 MG tablet Take 1 tablet (10 mg total) by mouth daily. 90 tablet 1   No current facility-administered medications on file prior to visit.    ALLERGIES: Other and Ivp dye [iodinated contrast media]  Family History  Problem Relation Age of Onset   Hypertension Mother    Diabetes Mother    Stroke Mother    Vision loss Mother    Asthma Mother    Alcohol abuse Father    Cancer Father    Stroke Father    Hypertension Sister    Early death Brother  Cancer Brother        Brain tumor    Diabetes Sister    Hypertension Sister    Cancer Brother    Early death Brother    Diabetes Brother    Hypertension Brother    Hypertension Brother    Cancer Brother    Diabetes Brother    Hypertension Brother    Cancer Brother    Diabetes Brother    Hypertension Brother    Vision loss Brother    Stroke Brother     SH:  single, non smoker  Review of Systems  Constitutional: Negative.   Genitourinary: Negative.   Psychiatric/Behavioral: Negative.     PHYSICAL EXAMINATION:    BP (!) 150/58 (BP Location: Left Arm, Patient Position: Sitting, Cuff Size: Large)    Pulse 62    Ht 5\' 3"  (1.6 m) Comment: reported   Wt  183 lb 12.8 oz (83.4 kg)    BMI 32.56 kg/m     General appearance: alert, cooperative and appears stated age Lymph:  no inguinal LAD noted  Pelvic: External genitalia:  internal labia majora is hypopigmented almost from the clitoris down to the vagina bilaterally.  Two pigmented, circular lesion on the inner labia majora (one on each side noted).  There is a little irregularity to the pigmentation.  No skin thickening, no ulcerations.  Clitoris is enlarged.              Urethra:  normal appearing urethra with no masses, tenderness or lesions              Bartholins and Skenes: normal                 Vagina: normal appearing vagina with normal color and discharge, no lesions              Procedure:  Area cleansed with Betadine.  Sterile technique used throughout procedure.  Skin anesthestized with Lidocaine 1% plain; 1.31mL.  3 punch biopsy used to obtain specimen.  Biopsy grasped with pick-ups and excised with scissors.  Adequate hemostasis obtained with silver nitrate sticks.  Dressing was not applied.  Pt tolerated procedure well.   Chaperone, 4m, CMA, was present for exam.  Assessment/Plan: 1. Abnormal skin of vulva - Surgical pathology( [place]/ [facility])  2. Clitorimegaly - Testosterone, Total, LC/MS/MS  3. Lichen sclerosus - will add mometasone ointment 0.1% for pt to use a maintenance therapy for lichen sclerosus twice weekly

## 2021-11-14 LAB — TESTOSTERONE, TOTAL, LC/MS/MS: Testosterone, total: 31.8 ng/dL (ref 7.0–40.0)

## 2021-11-18 LAB — SURGICAL PATHOLOGY

## 2022-01-02 ENCOUNTER — Ambulatory Visit: Payer: No Typology Code available for payment source

## 2022-01-14 DIAGNOSIS — H5203 Hypermetropia, bilateral: Secondary | ICD-10-CM | POA: Diagnosis not present

## 2022-02-09 ENCOUNTER — Ambulatory Visit (INDEPENDENT_AMBULATORY_CARE_PROVIDER_SITE_OTHER): Payer: No Typology Code available for payment source | Admitting: Obstetrics & Gynecology

## 2022-02-09 DIAGNOSIS — N90811 Female genital mutilation Type I status: Secondary | ICD-10-CM

## 2022-02-09 DIAGNOSIS — L900 Lichen sclerosus et atrophicus: Secondary | ICD-10-CM

## 2022-02-09 DIAGNOSIS — L280 Lichen simplex chronicus: Secondary | ICD-10-CM | POA: Diagnosis not present

## 2022-02-09 NOTE — Progress Notes (Signed)
GYNECOLOGY  VISIT ? ?CC:   vulvar recheck ? ?HPI: ?71 y.o. 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  This could be vitiligo as well.  Using mometasone.  No itching.  Doing well.  Pathology reviewed.  Questions answered. ? ?At last visit, pt did have finding of enlarged clitoris.  Total testosterone was normal.  Additional lab work recommended.  Pt comfortable having this done today. ? ? ?Patient Active Problem List  ? Diagnosis Date Noted  ? BMI 32.0-32.9,adult 04/05/2020  ? 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 Lichen sclerosus 04/06/2019  ? Diabetes mellitus without complication (HCC) 02/23/2018  ? Essential hypertension 02/23/2018  ? Hyperlipidemia associated with type 2 diabetes mellitus (HCC) 02/23/2018  ? ? ?Past Medical History:  ?Diagnosis Date  ? Allergy   ? Diabetes mellitus without complication (HCC)   ? Glaucoma   ? History of acute pancreatitis   ? History of herpes zoster   ? Hyperlipidemia   ? Hypertension   ? ? ?Past Surgical History:  ?Procedure Laterality Date  ? CARPAL TUNNEL RELEASE    ? CHOLECYSTECTOMY    ? NECK SURGERY    ? TUBAL LIGATION    ? TUMOR REMOVAL Left 2016  ? Neck   ? ? ?MEDS:   ?Current Outpatient Medications on File Prior to Visit  ?Medication Sig Dispense Refill  ? acetaminophen (TYLENOL) 500 MG tablet Take 500 mg by mouth every 6 (six) hours as needed.    ? aspirin-sod bicarb-citric acid (ALKA-SELTZER) 325 MG TBEF tablet Take 325 mg by mouth every 6 (six) hours as needed.    ? cetirizine (ZYRTEC) 10 MG tablet Take 1 tablet (10 mg total) by mouth daily. 30 tablet 5  ? diphenhydramine-acetaminophen (TYLENOL PM) 25-500 MG TABS tablet Take 1 tablet by mouth at bedtime as needed.    ? ibuprofen (ADVIL) 800 MG tablet Take 1 tablet (800 mg total) by mouth 3 (three) times daily. 21 tablet 0  ? lisinopril-hydrochlorothiazide (ZESTORETIC)  10-12.5 MG tablet Take 1 tablet by mouth daily. 90 tablet 1  ? meloxicam (MOBIC) 7.5 MG tablet TAKE 1 TABLET BY MOUTH EVERY DAY 90 tablet 0  ? mometasone (ELOCON) 0.1 % ointment Apply to inner labia twice weekly.  For example, on Mondays and Thursdays (Patient not taking: Reported on 02/09/2022) 45 g 3  ? ?No current facility-administered medications on file prior to visit.  ? ? ?ALLERGIES: Other and Ivp dye [iodinated contrast media] ? ?Family History  ?Problem Relation Age of Onset  ? Hypertension Mother   ? Diabetes Mother   ? Stroke Mother   ? Vision loss Mother   ? Asthma Mother   ? Alcohol abuse Father   ? Cancer Father   ? Stroke Father   ? Hypertension Sister   ? Early death Brother   ? Cancer Brother   ?     Brain tumor   ? Diabetes Sister   ? Hypertension Sister   ? Cancer Brother   ? Early death Brother   ? Diabetes Brother   ? Hypertension Brother   ? Hypertension Brother   ? Cancer Brother   ? Diabetes Brother   ? Hypertension Brother   ? Cancer Brother   ? Diabetes Brother   ? Hypertension Brother   ? Vision loss Brother   ? Stroke Brother   ? ? ?SH:  single, non smoker ? ?Review of Systems  ?Genitourinary: Negative.   ? ?PHYSICAL EXAMINATION:    ?General appearance: alert, cooperative and appears stated age ? ?Lymph:  no inguinal LAD noted ? ?Pelvic: External genitalia: inner labia majora areas of hypopigmentation.  No thickened tissues.  No areas of ulceration. ?             Urethra:  normal appearing urethra with no masses, tenderness or lesions ?             Bartholins and Skenes: normal    ?             Vagina: normal appearing vagina with normal color and discharge, no lesions ?            ? ?Assessment/Plan: ?1. Lichen simplex chronicus ?- continue with twice weekly treatment with mometasone as needed.  Will recheck 1 year.  If stable, can likely stop.   ? ?2. Clitorectomy ?- had normal testosterone level ?- 17-Hydroxyprogesterone ?- Prolactin ?- DHEA-sulfate ? ? ? ?

## 2022-02-13 ENCOUNTER — Encounter (HOSPITAL_BASED_OUTPATIENT_CLINIC_OR_DEPARTMENT_OTHER): Payer: Self-pay | Admitting: Obstetrics & Gynecology

## 2022-02-13 LAB — 17-HYDROXYPROGESTERONE: 17-OH Progesterone LCMS: 21 ng/dL

## 2022-02-13 LAB — PROLACTIN: Prolactin: 11.2 ng/mL (ref 4.8–23.3)

## 2022-02-13 LAB — DHEA-SULFATE: DHEA-SO4: 119 ug/dL (ref 20.4–186.6)

## 2022-02-23 ENCOUNTER — Ambulatory Visit (INDEPENDENT_AMBULATORY_CARE_PROVIDER_SITE_OTHER): Payer: No Typology Code available for payment source

## 2022-02-23 ENCOUNTER — Ambulatory Visit (INDEPENDENT_AMBULATORY_CARE_PROVIDER_SITE_OTHER): Payer: No Typology Code available for payment source | Admitting: Nurse Practitioner

## 2022-02-23 ENCOUNTER — Encounter: Payer: Self-pay | Admitting: Nurse Practitioner

## 2022-02-23 VITALS — BP 111/64 | HR 66 | Temp 97.9°F | Resp 20 | Ht 63.0 in | Wt 185.0 lb

## 2022-02-23 DIAGNOSIS — E785 Hyperlipidemia, unspecified: Secondary | ICD-10-CM | POA: Diagnosis not present

## 2022-02-23 DIAGNOSIS — M25561 Pain in right knee: Secondary | ICD-10-CM

## 2022-02-23 DIAGNOSIS — Z6832 Body mass index (BMI) 32.0-32.9, adult: Secondary | ICD-10-CM | POA: Diagnosis not present

## 2022-02-23 DIAGNOSIS — E119 Type 2 diabetes mellitus without complications: Secondary | ICD-10-CM | POA: Diagnosis not present

## 2022-02-23 DIAGNOSIS — E1169 Type 2 diabetes mellitus with other specified complication: Secondary | ICD-10-CM

## 2022-02-23 DIAGNOSIS — I10 Essential (primary) hypertension: Secondary | ICD-10-CM

## 2022-02-23 LAB — BAYER DCA HB A1C WAIVED: HB A1C (BAYER DCA - WAIVED): 6 % — ABNORMAL HIGH (ref 4.8–5.6)

## 2022-02-23 MED ORDER — ROSUVASTATIN CALCIUM 10 MG PO TABS: 10.0000 mg | ORAL_TABLET | Freq: Every day | ORAL | 1 refills | Status: DC

## 2022-02-23 MED ORDER — MELOXICAM 7.5 MG PO TABS: 7.5000 mg | ORAL_TABLET | Freq: Every day | ORAL | 1 refills | Status: DC

## 2022-02-23 MED ORDER — LISINOPRIL-HYDROCHLOROTHIAZIDE 10-12.5 MG PO TABS: 1.0000 | ORAL_TABLET | Freq: Every day | ORAL | 1 refills | Status: DC

## 2022-02-23 NOTE — Progress Notes (Signed)
? ?Subjective:  ? ? Patient ID: Debra Patton, female    DOB: [birth year], 71 y.o.   MRN: [id number] ? ? ?Chief Complaint: medical management of chronic issues  ?  ? ?HPI: ? ?Debra Patton is a 71 y.o. who identifies as a female who was assigned female at birth.  ? ?Social history: ?Lives with: by herself- family gets in contact with her daily ?Work history: retired ? ? ?Comes in today for follow up of the following chronic medical issues: ? ?1. Essential hypertension ?No c/o chest pain, sob or headache. Does not check blood pressure at home. ?BP Readings from Last 3 Encounters:  ?02/23/22 111/64  ?11/12/21 (!) 150/58  ?08/25/21 124/67  ? ? ? ? ?2. Hyperlipidemia associated with type 2 diabetes mellitus ([place]) ?Does tru to wtahc diet , but doe sno dedicated exercise. ?Lab Results  ?Component Value Date  ? CHOL 174 08/25/2021  ? HDL 54 08/25/2021  ? LDLCALC 105 (H) 08/25/2021  ? TRIG 83 08/25/2021  ? CHOLHDL 3.2 08/25/2021  ? ?The 10-year ASCVD risk score (Arnett DK, et al., 2019) is: 17.2% ? ? ?3. Diabetes mellitus without complication ([place]) ?She doe snot check her blood sugars at home. She is on diet control. She does watch sweets. ?Lab Results  ?Component Value Date  ? HGBA1C 5.7 (H) 08/25/2021  ? ? ? ?4. BMI 32.0-32.9,adult ?No recent weight changes ?Wt Readings from Last 3 Encounters:  ?02/23/22 185 lb (83.9 kg)  ?11/12/21 183 lb 12.8 oz (83.4 kg)  ?08/25/21 183 lb (83 kg)  ? ?BMI Readings from Last 3 Encounters:  ?02/23/22 32.77 kg/m?  ?11/12/21 32.56 kg/m?  ?08/25/21 33.47 kg/m?  ? ? ? ? ?New complaints: ?She is having right knee pain. She says this started last week. Pops and cracks when she moves it. Slight swelling. Rates pain 6/10. Alternating sitting and standing helps. ? ?Allergies  ?Allergen Reactions  ? Other Anaphylaxis  ? Ivp Dye [Iodinated Contrast Media]   ? ?Outpatient Encounter Medications as of 02/23/2022  ?Medication Sig  ? acetaminophen (TYLENOL) 500 MG tablet Take 500 mg by mouth every 6  (six) hours as needed.  ? aspirin-sod bicarb-citric acid (ALKA-SELTZER) 325 MG TBEF tablet Take 325 mg by mouth every 6 (six) hours as needed.  ? cetirizine (ZYRTEC) 10 MG tablet Take 1 tablet (10 mg total) by mouth daily.  ? diphenhydramine-acetaminophen (TYLENOL PM) 25-500 MG TABS tablet Take 1 tablet by mouth at bedtime as needed.  ? ibuprofen (ADVIL) 800 MG tablet Take 1 tablet (800 mg total) by mouth 3 (three) times daily.  ? lisinopril-hydrochlorothiazide (ZESTORETIC) 10-12.5 MG tablet Take 1 tablet by mouth daily.  ? meloxicam (MOBIC) 7.5 MG tablet TAKE 1 TABLET BY MOUTH EVERY DAY  ? mometasone (ELOCON) 0.1 % ointment Apply to inner labia twice weekly.  For example, on Mondays and Thursdays (Patient not taking: Reported on 02/09/2022)  ? ?No facility-administered encounter medications on file as of 02/23/2022.  ? ? ?Past Surgical History:  ?Procedure Laterality Date  ? CARPAL TUNNEL RELEASE    ? CHOLECYSTECTOMY    ? NECK SURGERY    ? TUBAL LIGATION    ? TUMOR REMOVAL Left 2016  ? Neck   ? ? ?Family History  ?Problem Relation Age of Onset  ? Hypertension Mother   ? Diabetes Mother   ? Stroke Mother   ? Vision loss Mother   ? Asthma Mother   ? Alcohol abuse Father   ? Cancer Father   ?  Stroke Father   ? Hypertension Sister   ? Early death Brother   ? Cancer Brother   ?     Brain tumor   ? Diabetes Sister   ? Hypertension Sister   ? Cancer Brother   ? Early death Brother   ? Diabetes Brother   ? Hypertension Brother   ? Hypertension Brother   ? Cancer Brother   ? Diabetes Brother   ? Hypertension Brother   ? Cancer Brother   ? Diabetes Brother   ? Hypertension Brother   ? Vision loss Brother   ? Stroke Brother   ? ? ? ? ?Controlled substance contract: n/a ? ? ? ? ?Review of Systems  ?Constitutional:  Negative for diaphoresis.  ?Eyes:  Negative for pain.  ?Respiratory:  Negative for shortness of breath.   ?Cardiovascular:  Negative for chest pain, palpitations and leg swelling.  ?Gastrointestinal:  Negative for  abdominal pain.  ?Endocrine: Negative for polydipsia.  ?Musculoskeletal:  Positive for arthralgias (right knee pain).  ?Skin:  Negative for rash.  ?Neurological:  Negative for dizziness, weakness and headaches.  ?Hematological:  Does not bruise/bleed easily.  ?All other systems reviewed and are negative. ? ?   ?Objective:  ? Physical Exam ?Vitals and nursing note reviewed.  ?Constitutional:   ?   General: She is not in acute distress. ?   Appearance: Normal appearance. She is well-developed.  ?HENT:  ?   Head: Normocephalic.  ?   Right Ear: Tympanic membrane normal.  ?   Left Ear: Tympanic membrane normal.  ?   Nose: Nose normal.  ?   Mouth/Throat:  ?   Mouth: Mucous membranes are moist.  ?Eyes:  ?   Pupils: Pupils are equal, round, and reactive to light.  ?Neck:  ?   Vascular: No carotid bruit or JVD.  ?Cardiovascular:  ?   Rate and Rhythm: Normal rate and regular rhythm.  ?   Heart sounds: Normal heart sounds.  ?Pulmonary:  ?   Effort: Pulmonary effort is normal. No respiratory distress.  ?   Breath sounds: Normal breath sounds. No wheezing or rales.  ?Chest:  ?   Chest wall: No tenderness.  ?Abdominal:  ?   General: Bowel sounds are normal. There is no distension or abdominal bruit.  ?   Palpations: Abdomen is soft. There is no hepatomegaly, splenomegaly, mass or pulsatile mass.  ?   Tenderness: There is no abdominal tenderness.  ?Genitourinary: ?   General: Normal vulva.  ?Musculoskeletal:     ?   General: Normal range of motion.  ?   Cervical back: Normal range of motion and neck supple.  ?   Comments: FROM with crepitus in flexion and extension ?Mild right knee effusion ?No patella tenderness ?All right knee ligaments intact  ?Lymphadenopathy:  ?   Cervical: No cervical adenopathy.  ?Skin: ?   General: Skin is warm and dry.  ?Neurological:  ?   Mental Status: She is alert and oriented to person, place, and time.  ?   Deep Tendon Reflexes: Reflexes are normal and symmetric.  ?Psychiatric:     ?   Behavior:  Behavior normal.     ?   Thought Content: Thought content normal.     ?   Judgment: Judgment normal.  ? ? ?BP 111/64   Pulse 66   Temp 97.9 ?F (36.6 ?C) (Temporal)   Resp 20   Ht 5\' 3"  (1.6 m)   Wt 185 lb (83.9  kg)   SpO2 96%   BMI 32.77 kg/m?  ? ? ?Right knee xray- mild oste arthritis of right knee-Preliminary reading by Ronnald Collum, FNP  [facility] ? ?   ?Assessment & Plan:  ? ?Debra Patton in today with chief complaint of Medical Management of Chronic Issues ? ? ?1. Essential hypertension ?Low sodium diet ?- lisinopril-hydrochlorothiazide (ZESTORETIC) 10-12.5 MG tablet; Take 1 tablet by mouth daily.  Dispense: 90 tablet; Refill: 1 ? ?2. Hyperlipidemia associated with type 2 diabetes mellitus ([place]) ?Low fat diet ?- rosuvastatin (CRESTOR) 10 MG tablet; Take 1 tablet (10 mg total) by mouth daily.  Dispense: 90 tablet; Refill: 1 ? ?3. Diabetes mellitus without complication ([place]) ?Continue  to watch carbs in diet ?- ?4. BMI 32.0-32.9,adult ?Discussed diet and exercise for person with BMI >25 ?Will recheck weight in 3-6 months ? ? ?5. Acute pain of right knee ?Continue to water knee brace ?Ice BID ?Rest ?If doe snot improve will do ortho referral ?- DG Knee 1-2 Views Right ? meloxicam (MOBIC) 7.5 MG tablet; Take 1 tablet (7.5 mg total) by mouth daily.  Dispense: 90 tablet; Refill: 1 ? ? ?The above assessment and management plan was discussed with the patient. The patient verbalized understanding of and has agreed to the management plan. Patient is aware to call the clinic if symptoms persist or worsen. Patient is aware when to return to the clinic for a follow-up visit. Patient educated on when it is appropriate to go to the emergency department.  ? ?Mary-Margaret Hassell Done, FNP ? ? ?

## 2022-02-23 NOTE — Patient Instructions (Signed)
Acute Knee Pain, Adult Many things can cause knee pain. Sometimes, knee pain is sudden (acute) and may be caused by damage, swelling, or irritation of the muscles and tissues that support your knee. The pain often goes away on its own with time and rest. If the pain does not go away, tests may be done to find out what is causing the pain. Follow these instructions at home: If you have a knee sleeve or brace:  Wear the knee sleeve or brace as told by your doctor. Take it off only as told by your doctor. Loosen it if your toes: Tingle. Become numb. Turn cold and blue. Keep it clean. If the knee sleeve or brace is not waterproof: Do not let it get wet. Cover it with a watertight covering when you take a bath or shower. Activity Rest your knee. Do not do things that cause pain or make pain worse. Avoid activities where both feet leave the ground at the same time (high-impact activities). Examples are running, jumping rope, and doing jumping jacks. Work with a physical therapist to make a safe exercise program, as told by your doctor. Managing pain, stiffness, and swelling  If told, put ice on the knee. To do this: If you have a removable knee sleeve or brace, take it off as told by your doctor. Put ice in a plastic bag. Place a towel between your skin and the bag. Leave the ice on for 20 minutes, 2-3 times a day. Take off the ice if your skin turns bright red. This is very important. If you cannot feel pain, heat, or cold, you have a greater risk of damage to the area. If told, use an elastic bandage to put pressure (compression) on your injured knee. Raise your knee above the level of your heart while you are sitting or lying down. Sleep with a pillow under your knee. General instructions Take over-the-counter and prescription medicines only as told by your doctor. Do not smoke or use any products that contain nicotine or tobacco. If you need help quitting, ask your doctor. If you are  overweight, work with your doctor and a food expert (dietitian) to set goals to lose weight. Being overweight can make your knee hurt more. Watch for any changes in your symptoms. Keep all follow-up visits. Contact a doctor if: The knee pain does not stop. The knee pain changes or gets worse. You have a fever along with knee pain. Your knee is red or feels warm when you touch it. Your knee gives out or locks up. Get help right away if: Your knee swells, and the swelling gets worse. You cannot move your knee. You have very bad knee pain that does not get better with pain medicine. Summary Many things can cause knee pain. The pain often goes away on its own with time and rest. Your doctor may do tests to find out the cause of the pain. Watch for any changes in your symptoms. Relieve your pain with rest, medicines, light activity, and use of ice. Get help right away if you cannot move your knee or your knee pain is very bad. This information is not intended to replace advice given to you by your health care provider. Make sure you discuss any questions you have with your health care provider. Document Revised: 04/10/2020 Document Reviewed: 04/10/2020 Elsevier Patient Education  2023 [employer]  

## 2022-02-24 LAB — CMP14+EGFR
ALT: 11 IU/L (ref 0–32)
AST: 12 IU/L (ref 0–40)
Albumin/Globulin Ratio: 1.6 (ref 1.2–2.2)
Albumin: 4.6 g/dL (ref 3.8–4.8)
Alkaline Phosphatase: 89 IU/L (ref 44–121)
BUN/Creatinine Ratio: 16 (ref 12–28)
BUN: 13 mg/dL (ref 8–27)
Bilirubin Total: 0.2 mg/dL (ref 0.0–1.2)
CO2: 22 mmol/L (ref 20–29)
Calcium: 9.5 mg/dL (ref 8.7–10.3)
Chloride: 105 mmol/L (ref 96–106)
Creatinine, Ser: 0.8 mg/dL (ref 0.57–1.00)
Globulin, Total: 2.8 g/dL (ref 1.5–4.5)
Glucose: 94 mg/dL (ref 70–99)
Potassium: 4.1 mmol/L (ref 3.5–5.2)
Sodium: 142 mmol/L (ref 134–144)
Total Protein: 7.4 g/dL (ref 6.0–8.5)
eGFR: 80 mL/min/{1.73_m2} (ref >59)

## 2022-02-24 LAB — CBC WITH DIFFERENTIAL/PLATELET
Basophils Absolute: 0 10*3/uL (ref 0.0–0.2)
Basos: 0 %
EOS (ABSOLUTE): 0.3 10*3/uL (ref 0.0–0.4)
Eos: 6 %
Hematocrit: 34.9 % (ref 34.0–46.6)
Hemoglobin: 11.3 g/dL (ref 11.1–15.9)
Immature Grans (Abs): 0 10*3/uL (ref 0.0–0.1)
Immature Granulocytes: 0 %
Lymphocytes Absolute: 1.9 10*3/uL (ref 0.7–3.1)
Lymphs: 36 %
MCH: 26.8 pg (ref 26.6–33.0)
MCHC: 32.4 g/dL (ref 31.5–35.7)
MCV: 83 fL (ref 79–97)
Monocytes Absolute: 0.4 10*3/uL (ref 0.1–0.9)
Monocytes: 8 %
Neutrophils Absolute: 2.8 10*3/uL (ref 1.4–7.0)
Neutrophils: 50 %
Platelets: 366 10*3/uL (ref 150–450)
RBC: 4.21 x10E6/uL (ref 3.77–5.28)
RDW: 13 % (ref 11.7–15.4)
WBC: 5.5 10*3/uL (ref 3.4–10.8)

## 2022-02-24 LAB — LIPID PANEL
Chol/HDL Ratio: 3.1 ratio (ref 0.0–4.4)
Cholesterol, Total: 140 mg/dL (ref 100–199)
HDL: 45 mg/dL (ref >39)
LDL Chol Calc (NIH): 81 mg/dL (ref 0–99)
Triglycerides: 68 mg/dL (ref 0–149)
VLDL Cholesterol Cal: 14 mg/dL (ref 5–40)

## 2022-03-02 ENCOUNTER — Ambulatory Visit (INDEPENDENT_AMBULATORY_CARE_PROVIDER_SITE_OTHER): Payer: No Typology Code available for payment source

## 2022-03-02 VITALS — Ht 63.0 in | Wt 185.0 lb

## 2022-03-02 DIAGNOSIS — Z1231 Encounter for screening mammogram for malignant neoplasm of breast: Secondary | ICD-10-CM | POA: Diagnosis not present

## 2022-03-02 DIAGNOSIS — Z780 Asymptomatic menopausal state: Secondary | ICD-10-CM | POA: Diagnosis not present

## 2022-03-02 DIAGNOSIS — Z0000 Encounter for general adult medical examination without abnormal findings: Secondary | ICD-10-CM | POA: Diagnosis not present

## 2022-03-02 NOTE — Patient Instructions (Signed)
Debra Patton , ?Thank you for taking time to come for your Medicare Wellness Visit. I appreciate your ongoing commitment to your health goals. Please review the following plan we discussed and let me know if I can assist you in the future.  ? ?Screening recommendations/referrals: ?Colonoscopy: Done 03/02/2014 Repeat in 10 years ? ?Mammogram: Done 08/29/2020 Repeat annually ? ?Bone Density: Done 08/12/2020 Repeat every 2 years ? ?Recommended yearly ophthalmology/optometry visit for glaucoma screening and checkup ?Recommended yearly dental visit for hygiene and checkup ? ?Vaccinations: ?Influenza vaccine: Done 09/12/2021 Repeat annually ? ?Pneumococcal vaccine: Discussed. ?Tdap vaccine: Done 04/27/2017 Repeat in 10 years ? ?Shingles vaccine: Done 09/26/2021 and 01/05/2022 ?Covid-19:Done 06/02/2020 and 06/30/2020 ? ?Advanced directives: Advance directive discussed with you today. Even though you declined this today, please call our office should you change your mind, and we can give you the proper paperwork for you to fill out. ? ? ?Conditions/risks identified: Aim for 30 minutes of exercise or brisk walking, 6-8 glasses of water, and 5 servings of fruits and vegetables each day. ? ? ?Next appointment: Follow up in one year for your annual wellness visit 2024 ? ? ?Preventive Care 60 Years and Older, Female ?Preventive care refers to lifestyle choices and visits with your health care provider that can promote health and wellness. ?What does preventive care include? ?A yearly physical exam. This is also called an annual well check. ?Dental exams once or twice a year. ?Routine eye exams. Ask your health care provider how often you should have your eyes checked. ?Personal lifestyle choices, including: ?Daily care of your teeth and gums. ?Regular physical activity. ?Eating a healthy diet. ?Avoiding tobacco and drug use. ?Limiting alcohol use. ?Practicing safe sex. ?Taking low-dose aspirin every day. ?Taking vitamin and mineral  supplements as recommended by your health care provider. ?What happens during an annual well check? ?The services and screenings done by your health care provider during your annual well check will depend on your age, overall health, lifestyle risk factors, and family history of disease. ?Counseling  ?Your health care provider may ask you questions about your: ?Alcohol use. ?Tobacco use. ?Drug use. ?Emotional well-being. ?Home and relationship well-being. ?Sexual activity. ?Eating habits. ?History of falls. ?Memory and ability to understand (cognition). ?Work and work Statistician. ?Reproductive health. ?Screening  ?You may have the following tests or measurements: ?Height, weight, and BMI. ?Blood pressure. ?Lipid and cholesterol levels. These may be checked every 5 years, or more frequently if you are over 55 years old. ?Skin check. ?Lung cancer screening. You may have this screening every year starting at age 15 if you have a 30-pack-year history of smoking and currently smoke or have quit within the past 15 years. ?Fecal occult blood test (FOBT) of the stool. You may have this test every year starting at age 82. ?Flexible sigmoidoscopy or colonoscopy. You may have a sigmoidoscopy every 5 years or a colonoscopy every 10 years starting at age 49. ?Hepatitis C blood test. ?Hepatitis B blood test. ?Sexually transmitted disease (STD) testing. ?Diabetes screening. This is done by checking your blood sugar (glucose) after you have not eaten for a while (fasting). You may have this done every 1-3 years. ?Bone density scan. This is done to screen for osteoporosis. You may have this done starting at age 23. ?Mammogram. This may be done every 1-2 years. Talk to your health care provider about how often you should have regular mammograms. ?Talk with your health care provider about your test results, treatment options, and if  necessary, the need for more tests. ?Vaccines  ?Your health care provider may recommend certain  vaccines, such as: ?Influenza vaccine. This is recommended every year. ?Tetanus, diphtheria, and acellular pertussis (Tdap, Td) vaccine. You may need a Td booster every 10 years. ?Zoster vaccine. You may need this after age 41. ?Pneumococcal 13-valent conjugate (PCV13) vaccine. One dose is recommended after age 65. ?Pneumococcal polysaccharide (PPSV23) vaccine. One dose is recommended after age 3. ?Talk to your health care provider about which screenings and vaccines you need and how often you need them. ?This information is not intended to replace advice given to you by your health care provider. Make sure you discuss any questions you have with your health care provider. ?Document Released: 11/22/2015 Document Revised: 07/15/2016 Document Reviewed: 08/27/2015 ?Elsevier Interactive Patient Education ? 2017 [employer] ? ?Fall Prevention in the Home ?Falls can cause injuries. They can happen to people of all ages. There are many things you can do to make your home safe and to help prevent falls. ?What can I do on the outside of my home? ?Regularly fix the edges of walkways and driveways and fix any cracks. ?Remove anything that might make you trip as you walk through a door, such as a raised step or threshold. ?Trim any bushes or trees on the path to your home. ?Use bright outdoor lighting. ?Clear any walking paths of anything that might make someone trip, such as rocks or tools. ?Regularly check to see if handrails are loose or broken. Make sure that both sides of any steps have handrails. ?Any raised decks and porches should have guardrails on the edges. ?Have any leaves, snow, or ice cleared regularly. ?Use sand or salt on walking paths during winter. ?Clean up any spills in your garage right away. This includes oil or grease spills. ?What can I do in the bathroom? ?Use night lights. ?Install grab bars by the toilet and in the tub and shower. Do not use towel bars as grab bars. ?Use non-skid mats or decals in  the tub or shower. ?If you need to sit down in the shower, use a plastic, non-slip stool. ?Keep the floor dry. Clean up any water that spills on the floor as soon as it happens. ?Remove soap buildup in the tub or shower regularly. ?Attach bath mats securely with double-sided non-slip rug tape. ?Do not have throw rugs and other things on the floor that can make you trip. ?What can I do in the bedroom? ?Use night lights. ?Make sure that you have a light by your bed that is easy to reach. ?Do not use any sheets or blankets that are too big for your bed. They should not hang down onto the floor. ?Have a firm chair that has side arms. You can use this for support while you get dressed. ?Do not have throw rugs and other things on the floor that can make you trip. ?What can I do in the kitchen? ?Clean up any spills right away. ?Avoid walking on wet floors. ?Keep items that you use a lot in easy-to-reach places. ?If you need to reach something above you, use a strong step stool that has a grab bar. ?Keep electrical cords out of the way. ?Do not use floor polish or wax that makes floors slippery. If you must use wax, use non-skid floor wax. ?Do not have throw rugs and other things on the floor that can make you trip. ?What can I do with my stairs? ?Do not leave any items  on the stairs. ?Make sure that there are handrails on both sides of the stairs and use them. Fix handrails that are broken or loose. Make sure that handrails are as long as the stairways. ?Check any carpeting to make sure that it is firmly attached to the stairs. Fix any carpet that is loose or worn. ?Avoid having throw rugs at the top or bottom of the stairs. If you do have throw rugs, attach them to the floor with carpet tape. ?Make sure that you have a light switch at the top of the stairs and the bottom of the stairs. If you do not have them, ask someone to add them for you. ?What else can I do to help prevent falls? ?Wear shoes that: ?Do not have high  heels. ?Have rubber bottoms. ?Are comfortable and fit you well. ?Are closed at the toe. Do not wear sandals. ?If you use a stepladder: ?Make sure that it is fully opened. Do not climb a closed stepladder. ?Make s

## 2022-03-02 NOTE — Progress Notes (Signed)
? ?Subjective:  ? Debra Patton is a 71 y.o. female who presents for Medicare Annual (Subsequent) preventive examination. ?Virtual Visit via Telephone Note ? ?I connected with  Debra Patton on 03/02/22 at  3:15 PM EDT by telephone and verified that I am speaking with the correct person using two identifiers. ? ?Location: ?Patient: HOME ?Provider: [facility] ?Persons participating in the virtual visit: patient/Nurse Health Advisor ?  ?I discussed the limitations, risks, security and privacy concerns of performing an evaluation and management service by telephone and the availability of in person appointments. The patient expressed understanding and agreed to proceed. ? ?Interactive audio and video telecommunications were attempted between this nurse and patient, however failed, due to patient having technical difficulties OR patient did not have access to video capability.  We continued and completed visit with audio only. ? ?Some vital signs may be absent or patient reported.  ? ?Darral Dash, LPN ? ?Review of Systems    ? ?Cardiac Risk Factors include: advanced age (>98men, >67 women);hypertension;diabetes mellitus;dyslipidemia;sedentary lifestyle;obesity (BMI >30kg/m2) ? ?   ?Objective:  ?  ?Today's Vitals  ? 03/02/22 1522 03/02/22 1524  ?Weight: 185 lb (83.9 kg)   ?Height: 5\' 3"  (1.6 m)   ?PainSc:  5   ? ?Body mass index is 32.77 kg/m?. ? ? ?  03/02/2022  ?  3:34 PM 02/28/2021  ?  3:21 PM 04/28/2020  ? 10:50 AM 05/01/2019  ?  3:11 PM 04/28/2018  ?  8:53 AM  ?Advanced Directives  ?Does Patient Have a Medical Advance Directive? No No No No No  ?Would patient like information on creating a medical advance directive? No - Patient declined No - Patient declined No - Patient declined No - Patient declined Yes (MAU/Ambulatory/Procedural Areas - Information given)  ? ? ?Current Medications (verified) ?Outpatient Encounter Medications as of 03/02/2022  ?Medication Sig  ? acetaminophen (TYLENOL) 500 MG tablet Take 500 mg  by mouth every 6 (six) hours as needed.  ? aspirin-sod bicarb-citric acid (ALKA-SELTZER) 325 MG TBEF tablet Take 325 mg by mouth every 6 (six) hours as needed.  ? cetirizine (ZYRTEC) 10 MG tablet Take 1 tablet (10 mg total) by mouth daily.  ? diphenhydramine-acetaminophen (TYLENOL PM) 25-500 MG TABS tablet Take 1 tablet by mouth at bedtime as needed.  ? ibuprofen (ADVIL) 800 MG tablet Take 1 tablet (800 mg total) by mouth 3 (three) times daily.  ? lisinopril-hydrochlorothiazide (ZESTORETIC) 10-12.5 MG tablet Take 1 tablet by mouth daily.  ? meloxicam (MOBIC) 7.5 MG tablet Take 1 tablet (7.5 mg total) by mouth daily.  ? mometasone (ELOCON) 0.1 % ointment Apply to inner labia twice weekly.  For example, on Mondays and Thursdays  ? rosuvastatin (CRESTOR) 10 MG tablet Take 1 tablet (10 mg total) by mouth daily.  ? ?No facility-administered encounter medications on file as of 03/02/2022.  ? ? ?Allergies (verified) ?Other and Ivp dye [iodinated contrast media]  ? ?History: ?Past Medical History:  ?Diagnosis Date  ? Allergy   ? Diabetes mellitus without complication (HCC)   ? Glaucoma   ? History of acute pancreatitis   ? History of herpes zoster   ? Hyperlipidemia   ? Hypertension   ? ?Past Surgical History:  ?Procedure Laterality Date  ? CARPAL TUNNEL RELEASE    ? CHOLECYSTECTOMY    ? NECK SURGERY    ? TUBAL LIGATION    ? TUMOR REMOVAL Left 2016  ? Neck   ? ?Family History  ?Problem Relation  Age of Onset  ? Hypertension Mother   ? Diabetes Mother   ? Stroke Mother   ? Vision loss Mother   ? Asthma Mother   ? Alcohol abuse Father   ? Cancer Father   ? Stroke Father   ? Hypertension Sister   ? Early death Brother   ? Cancer Brother   ?     Brain tumor   ? Diabetes Sister   ? Hypertension Sister   ? Cancer Brother   ? Early death Brother   ? Diabetes Brother   ? Hypertension Brother   ? Hypertension Brother   ? Cancer Brother   ? Diabetes Brother   ? Hypertension Brother   ? Cancer Brother   ? Diabetes Brother   ?  Hypertension Brother   ? Vision loss Brother   ? Stroke Brother   ? ?Social History  ? ?Socioeconomic History  ? Marital status: Single  ?  Spouse name: Not on file  ? Number of children: 2  ? Years of education: GED  ? Highest education level: GED or equivalent  ?Occupational History  ? Occupation: Retired  ?  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 Smoking status: Former  ?  Types: Cigarettes  ?  Quit date: 11/09/2004  ?  Years since quitting: 17.3  ? Smokeless tobacco: Never  ?Vaping Use  ? Vaping Use: Never used  ?Substance and Sexual Activity  ? Alcohol use: Not Currently  ? Drug use: Never  ? Sexual activity: Not Currently  ?Other Topics Concern  ? Not on file  ?Social History Narrative  ? Not on file  ? ?Social Determinants of Health  ? ?Financial Resource Strain: Low Risk   ? Difficulty of Paying Living Expenses: Not hard at all  ?Food Insecurity: No Food Insecurity  ? Worried About Programme researcher, broadcasting/film/videounning Out of Food in the Last Year: Never true  ? Ran Out of Food in the Last Year: Never true  ?Transportation Needs: No Transportation Needs  ? Lack of Transportation (Medical): No  ? Lack of Transportation (Non-Medical): No  ?Physical Activity: Sufficiently Active  ? Days of Exercise per Week: 5 days  ? Minutes of Exercise per Session: 30 min  ?Stress: No Stress Concern Present  ? Feeling of Stress : Not at all  ?Social Connections: Moderately Integrated  ? Frequency of Communication with Friends and Family: More than three times a week  ? Frequency of Social Gatherings with Friends and Family: More than three times a week  ? Attends Religious Services: More than 4 times per year  ? Active Member of Clubs or Organizations: Yes  ? Attends BankerClub or Organization Meetings: More than 4 times per year  ? Marital Status: Widowed  ? ? ?Tobacco Counseling ?Counseling given: Not Answered ? ? ?Clinical Intake: ? ?Pre-visit preparation completed: Yes ? ?Pain : 0-10 ?Pain Score: 5  ?Pain Type: Chronic pain ?Pain Location:  Knee ?Pain Descriptors / Indicators: Aching, Dull ?Pain Onset: More than a month ago ?Pain Frequency: Intermittent ? ?  ? ?BMI - recorded: 32.77 ?Nutritional Status: BMI > 30  Obese ?Nutritional Risks: None ?Diabetes: No ? ?How often do you need to have someone help you when you read instructions, pamphlets, or other written materials from your doctor or pharmacy?: 1 - Never ? ?Diabetic?Pt states she is not currently taking any medication for her diabetes. Diet controlled.  ? ?Interpreter Needed?: No ? ?Information entered by :: mj Starlette Thurow,lpn ? ? ?  Activities of Daily Living ? ?  03/02/2022  ?  3:39 PM  ?In your present state of health, do you have any difficulty performing the following activities:  ?Hearing? 0  ?Vision? 0  ?Difficulty concentrating or making decisions? 0  ?Walking or climbing stairs? 0  ?Dressing or bathing? 0  ?Doing errands, shopping? 0  ?Preparing Food and eating ? N  ?Using the Toilet? N  ?In the past six months, have you accidently leaked urine? N  ?Do you have problems with loss of bowel control? N  ?Managing your Medications? N  ?Managing your Finances? N  ?Housekeeping or managing your Housekeeping? N  ? ? ?Patient Care Team: ?Bennie Pierini, FNP as PCP - General (Family Medicine) ? ?Indicate any recent Medical Services you may have received from other than [facility] providers in the past year (date may be approximate). ? ?   ?Assessment:  ? This is a routine wellness examination for Shailey. ? ?Hearing/Vision screen ?Hearing Screening - Comments:: No hearing issues.  ?Vision Screening - Comments:: Glasses. [facility] in [place] 12/2021 ? ?Dietary issues and exercise activities discussed: ?Current Exercise Habits: Home exercise routine, Type of exercise: walking, Time (Minutes): 30, Frequency (Times/Week): 5, Weekly Exercise (Minutes/Week): 150, Intensity: Mild, Exercise limited by: cardiac condition(s);orthopedic condition(s) ? ? Goals Addressed   ? ?  ?  ?  ?  ? This Visit's Progress  ?   Have 3 meals a day   On track  ?  Increase physical activity   On track  ? ?  ? ?Depression Screen ? ?  03/02/2022  ?  3:29 PM 02/23/2022  ?  2:35 PM 02/09/2022  ? 11:15 AM 11/12/2021  ? 11:14 AM 08/25/2021  ?  2:35 PM 8/

## 2022-03-11 ENCOUNTER — Ambulatory Visit
Admission: RE | Admit: 2022-03-11 | Discharge: 2022-03-11 | Disposition: A | Discharge status: Home / Self Care | Payer: No Typology Code available for payment source | Source: Ambulatory Visit | Attending: Nurse Practitioner | Admitting: Nurse Practitioner

## 2022-03-11 ENCOUNTER — Ambulatory Visit: Payer: No Typology Code available for payment source

## 2022-03-11 DIAGNOSIS — Z1231 Encounter for screening mammogram for malignant neoplasm of breast: Secondary | ICD-10-CM

## 2022-07-04 DIAGNOSIS — Z008 Encounter for other general examination: Secondary | ICD-10-CM | POA: Diagnosis not present

## 2022-07-04 DIAGNOSIS — E7849 Other hyperlipidemia: Secondary | ICD-10-CM | POA: Diagnosis not present

## 2022-08-10 ENCOUNTER — Encounter: Payer: Self-pay | Admitting: Nurse Practitioner

## 2022-08-10 ENCOUNTER — Ambulatory Visit (INDEPENDENT_AMBULATORY_CARE_PROVIDER_SITE_OTHER): Payer: No Typology Code available for payment source | Admitting: Nurse Practitioner

## 2022-08-10 VITALS — BP 144/70 | HR 78 | Temp 98.0°F | Resp 20 | Ht 63.0 in | Wt 192.0 lb

## 2022-08-10 DIAGNOSIS — M25561 Pain in right knee: Secondary | ICD-10-CM | POA: Diagnosis not present

## 2022-08-10 MED ORDER — CELECOXIB 400 MG PO CAPS: 400.0000 mg | ORAL_CAPSULE | Freq: Every day | ORAL | 1 refills | Status: DC

## 2022-08-10 NOTE — Patient Instructions (Signed)
Acute Knee Pain, Adult Many things can cause knee pain. Sometimes, knee pain is sudden (acute) and may be caused by damage, swelling, or irritation of the muscles and tissues that support your knee. The pain often goes away on its own with time and rest. If the pain does not go away, tests may be done to find out what is causing the pain. Follow these instructions at home: If you have a knee sleeve or brace:  Wear the knee sleeve or brace as told by your doctor. Take it off only as told by your doctor. Loosen it if your toes: Tingle. Become numb. Turn cold and blue. Keep it clean. If the knee sleeve or brace is not waterproof: Do not let it get wet. Cover it with a watertight covering when you take a bath or shower. Activity Rest your knee. Do not do things that cause pain or make pain worse. Avoid activities where both feet leave the ground at the same time (high-impact activities). Examples are running, jumping rope, and doing jumping jacks. Work with a physical therapist to make a safe exercise program, as told by your doctor. Managing pain, stiffness, and swelling  If told, put ice on the knee. To do this: If you have a removable knee sleeve or brace, take it off as told by your doctor. Put ice in a plastic bag. Place a towel between your skin and the bag. Leave the ice on for 20 minutes, 2-3 times a day. Take off the ice if your skin turns bright red. This is very important. If you cannot feel pain, heat, or cold, you have a greater risk of damage to the area. If told, use an elastic bandage to put pressure (compression) on your injured knee. Raise your knee above the level of your heart while you are sitting or lying down. Sleep with a pillow under your knee. General instructions Take over-the-counter and prescription medicines only as told by your doctor. Do not smoke or use any products that contain nicotine or tobacco. If you need help quitting, ask your doctor. If you are  overweight, work with your doctor and a food expert (dietitian) to set goals to lose weight. Being overweight can make your knee hurt more. Watch for any changes in your symptoms. Keep all follow-up visits. Contact a doctor if: The knee pain does not stop. The knee pain changes or gets worse. You have a fever along with knee pain. Your knee is red or feels warm when you touch it. Your knee gives out or locks up. Get help right away if: Your knee swells, and the swelling gets worse. You cannot move your knee. You have very bad knee pain that does not get better with pain medicine. Summary Many things can cause knee pain. The pain often goes away on its own with time and rest. Your doctor may do tests to find out the cause of the pain. Watch for any changes in your symptoms. Relieve your pain with rest, medicines, light activity, and use of ice. Get help right away if you cannot move your knee or your knee pain is very bad. This information is not intended to replace advice given to you by your health care provider. Make sure you discuss any questions you have with your health care provider. Document Revised: 04/10/2020 Document Reviewed: 04/10/2020 Elsevier Patient Education  2023 [employer]  

## 2022-08-10 NOTE — Progress Notes (Signed)
   Subjective:    Patient ID: Debra Patton, female    DOB: 1951-06-11, 71 y.o.   MRN: [id number]   Chief Complaint: Swelling in right leg (Worse from last visit/)   Pt seen today for pain and swelling in R knee; has been going on for at least 6 months, but feels it has gotten progressively worse the past few months. Full ROM, strength 5/5.   Knee Pain  There was no injury mechanism. The pain is present in the right knee. The quality of the pain is described as aching and shooting. The pain has been Fluctuating since onset. Associated symptoms include numbness. Associated symptoms comments: Occasional numbness to R foot. She reports no foreign bodies present. The symptoms are aggravated by weight bearing (ice). She has tried elevation, rest and acetaminophen for the symptoms. The treatment provided mild relief.       Review of Systems  Respiratory:  Negative for chest tightness and shortness of breath.   Cardiovascular:  Negative for chest pain and palpitations.  Musculoskeletal:  Positive for arthralgias and joint swelling.       R knee pain and swelling  Neurological:  Positive for numbness. Negative for weakness.  Hematological:  Does not bruise/bleed easily.  All other systems reviewed and are negative.      Objective:   Physical Exam Vitals and nursing note reviewed.  Constitutional:      General: She is not in acute distress.    Appearance: Normal appearance. She is well-developed.  Pulmonary:     Effort: Pulmonary effort is normal.  Musculoskeletal:     Right knee: Swelling present. No deformity. Normal range of motion. Tenderness present. Normal pulse.     Left knee: Normal.  Skin:    General: Skin is warm.  Neurological:     Mental Status: She is alert and oriented to person, place, and time.   BP (!) 144/70   Pulse 78   Temp 98 F (36.7 C) (Temporal)   Resp 20   Ht 5\' 3"  (1.6 m)   Wt 192 lb (87.1 kg)   SpO2 94%   BMI 34.01 kg/m           Assessment & Plan:   Debra Patton in today with chief complaint of Swelling in right leg (Worse from last visit/)   1. Acute pain of right knee Rest Ice Compression wrap elevate - Ambulatory referral to Orthopedic Surgery - celecoxib (CELEBREX) 400 MG capsule; Take 1 capsule (400 mg total) by mouth daily after breakfast.  Dispense: 30 capsule; Refill: 1    The above assessment and management plan was discussed with the patient. The patient verbalized understanding of and has agreed to the management plan. Patient is aware to call the clinic if symptoms persist or worsen. Patient is aware when to return to the clinic for a follow-up visit. Patient educated on when it is appropriate to go to the emergency department.   Mary-Margaret Hassell Done, FNP

## 2022-08-18 DIAGNOSIS — M25561 Pain in right knee: Secondary | ICD-10-CM | POA: Diagnosis not present

## 2022-08-25 ENCOUNTER — Other Ambulatory Visit: Payer: No Typology Code available for payment source

## 2022-08-25 ENCOUNTER — Ambulatory Visit: Payer: No Typology Code available for payment source | Admitting: Nurse Practitioner

## 2022-08-31 ENCOUNTER — Ambulatory Visit (INDEPENDENT_AMBULATORY_CARE_PROVIDER_SITE_OTHER): Payer: No Typology Code available for payment source | Admitting: Nurse Practitioner

## 2022-08-31 ENCOUNTER — Ambulatory Visit (INDEPENDENT_AMBULATORY_CARE_PROVIDER_SITE_OTHER): Payer: No Typology Code available for payment source

## 2022-08-31 ENCOUNTER — Encounter: Payer: Self-pay | Admitting: Nurse Practitioner

## 2022-08-31 VITALS — BP 121/64 | HR 77 | Temp 97.3°F | Resp 20 | Ht 63.0 in | Wt 188.0 lb

## 2022-08-31 DIAGNOSIS — E119 Type 2 diabetes mellitus without complications: Secondary | ICD-10-CM | POA: Diagnosis not present

## 2022-08-31 DIAGNOSIS — Z780 Asymptomatic menopausal state: Secondary | ICD-10-CM | POA: Diagnosis not present

## 2022-08-31 DIAGNOSIS — E1169 Type 2 diabetes mellitus with other specified complication: Secondary | ICD-10-CM

## 2022-08-31 DIAGNOSIS — Z6832 Body mass index (BMI) 32.0-32.9, adult: Secondary | ICD-10-CM

## 2022-08-31 DIAGNOSIS — I10 Essential (primary) hypertension: Secondary | ICD-10-CM | POA: Diagnosis not present

## 2022-08-31 DIAGNOSIS — Z23 Encounter for immunization: Secondary | ICD-10-CM

## 2022-08-31 DIAGNOSIS — M25561 Pain in right knee: Secondary | ICD-10-CM | POA: Diagnosis not present

## 2022-08-31 DIAGNOSIS — E785 Hyperlipidemia, unspecified: Secondary | ICD-10-CM

## 2022-08-31 LAB — BAYER DCA HB A1C WAIVED: HB A1C (BAYER DCA - WAIVED): 6 % — ABNORMAL HIGH (ref 4.8–5.6)

## 2022-08-31 MED ORDER — LISINOPRIL-HYDROCHLOROTHIAZIDE 10-12.5 MG PO TABS: 1.0000 | ORAL_TABLET | Freq: Every day | ORAL | 1 refills | Status: DC

## 2022-08-31 MED ORDER — CELECOXIB 400 MG PO CAPS: 400.0000 mg | ORAL_CAPSULE | Freq: Every day | ORAL | 1 refills | Status: DC

## 2022-08-31 MED ORDER — ROSUVASTATIN CALCIUM 10 MG PO TABS: 10.0000 mg | ORAL_TABLET | Freq: Every day | ORAL | 1 refills | Status: DC

## 2022-08-31 NOTE — Progress Notes (Signed)
Subjective:    Patient ID: Debra Patton, female    DOB: 10-13-1951, 71 y.o.   MRN: [id number]   Chief Complaint: medical management of chronic issues   HPI:  Debra Patton is a 71 y.o. who identifies as a female who was assigned female at birth.   Social history: Lives with: alone Work history: retired   Scientist, forensic in today for follow up of the following chronic medical issues:  1. Essential hypertension No c/o chest pain, sob or headaches. Does not check BP at home. BP Readings from Last 3 Encounters:  08/31/22 121/64  08/10/22 (!) 144/70  02/23/22 111/64     2. Hyperlipidemia associated with type 2 diabetes mellitus ([place]) Tries to watch her diet; no dedicated exercise. Lab Results  Component Value Date   CHOL 140 02/23/2022   HDL 45 02/23/2022   LDLCALC 81 02/23/2022   TRIG 68 02/23/2022   CHOLHDL 3.1 02/23/2022   The 10-year ASCVD risk score (Arnett DK, et al., 2019) is: 23.1%   3. Diabetes mellitus without complication ([place]) Does not check blood sugars at home. Currently on diet control and does try to limit amount of sweets. Lab Results  Component Value Date   HGBA1C 6.0 (H) 02/23/2022     4. BMI 32.0-32.9,adult Weight is down 4 lbs since last check. Wt Readings from Last 3 Encounters:  08/31/22 188 lb (85.3 kg)  08/10/22 192 lb (87.1 kg)  03/02/22 185 lb (83.9 kg)    BMI Readings from Last 3 Encounters:  08/31/22 33.30 kg/m  08/10/22 34.01 kg/m  03/02/22 32.77 kg/m      New complaints: None today  Allergies  Allergen Reactions   Other Anaphylaxis   Ivp Dye [Iodinated Contrast Media]    Outpatient Encounter Medications as of 08/31/2022  Medication Sig   acetaminophen (TYLENOL) 500 MG tablet Take 500 mg by mouth every 6 (six) hours as needed.   aspirin-sod bicarb-citric acid (ALKA-SELTZER) 325 MG TBEF tablet Take 325 mg by mouth every 6 (six) hours as needed.   celecoxib (CELEBREX) 400 MG capsule Take 1 capsule (400 mg total) by  mouth daily after breakfast.   cetirizine (ZYRTEC) 10 MG tablet Take 1 tablet (10 mg total) by mouth daily.   diphenhydramine-acetaminophen (TYLENOL PM) 25-500 MG TABS tablet Take 1 tablet by mouth at bedtime as needed.   ibuprofen (ADVIL) 800 MG tablet Take 1 tablet (800 mg total) by mouth 3 (three) times daily.   lisinopril-hydrochlorothiazide (ZESTORETIC) 10-12.5 MG tablet Take 1 tablet by mouth daily.   meloxicam (MOBIC) 7.5 MG tablet Take 1 tablet (7.5 mg total) by mouth daily.   mometasone (ELOCON) 0.1 % ointment Apply to inner labia twice weekly.  For example, on Mondays and Thursdays   rosuvastatin (CRESTOR) 10 MG tablet Take 1 tablet (10 mg total) by mouth daily.   No facility-administered encounter medications on file as of 08/31/2022.    Past Surgical History:  Procedure Laterality Date   CARPAL TUNNEL RELEASE     CHOLECYSTECTOMY     NECK SURGERY     TUBAL LIGATION     TUMOR REMOVAL Left 2016   Neck     Family History  Problem Relation Age of Onset   Hypertension Mother    Diabetes Mother    Stroke Mother    Vision loss Mother    Asthma Mother    Alcohol abuse Father    Cancer Father    Stroke Father    Hypertension Sister  Diabetes Sister    Hypertension Sister    Early death Brother    Cancer Brother        Brain tumor    Cancer Brother    Early death Brother    Diabetes Brother    Hypertension Brother    Hypertension Brother    Cancer Brother    Diabetes Brother    Hypertension Brother    Cancer Brother    Diabetes Brother    Hypertension Brother    Vision loss Brother    Stroke Brother    Breast cancer Neg Hx       Controlled substance contract: n/a    Review of Systems  Constitutional:  Negative for diaphoresis.  HENT:  Negative for ear pain.   Eyes:  Negative for pain.  Respiratory:  Negative for chest tightness and shortness of breath.   Cardiovascular:  Negative for chest pain, palpitations and leg swelling.  Gastrointestinal:   Negative for abdominal pain.  Endocrine: Negative for polydipsia and polyphagia.  Musculoskeletal:        Ongoing R knee pain; saw ortho; had fluid drained and received cortisone injection; supposed to return for follow -up in 6 months.  Neurological:  Negative for dizziness, weakness and headaches.  Hematological:  Negative for adenopathy. Does not bruise/bleed easily.  All other systems reviewed and are negative.      Objective:   Physical Exam Vitals and nursing note reviewed.  Constitutional:      General: She is not in acute distress.    Appearance: Normal appearance. She is well-developed. She is not ill-appearing.  HENT:     Head: Normocephalic and atraumatic.     Right Ear: Tympanic membrane normal.     Left Ear: Tympanic membrane normal.     Nose: Nose normal.  Eyes:     Conjunctiva/sclera: Conjunctivae normal.     Pupils: Pupils are equal, round, and reactive to light.  Neck:     Thyroid: No thyroid mass, thyromegaly or thyroid tenderness.     Vascular: No carotid bruit or JVD.  Cardiovascular:     Rate and Rhythm: Normal rate and regular rhythm.     Pulses: Normal pulses.          Dorsalis pedis pulses are 2+ on the right side and 2+ on the left side.       Posterior tibial pulses are 2+ on the right side and 2+ on the left side.     Heart sounds: Normal heart sounds.  Pulmonary:     Effort: Pulmonary effort is normal. No respiratory distress.     Breath sounds: Normal breath sounds. No wheezing or rhonchi.  Abdominal:     General: Bowel sounds are normal. There is no distension.     Palpations: Abdomen is soft.     Tenderness: There is no abdominal tenderness. There is no guarding.  Musculoskeletal:     Cervical back: Normal range of motion. No tenderness.  Feet:     Right foot:     Protective Sensation: 4 sites tested.  4 sites sensed.     Skin integrity: Skin integrity normal. No ulcer, blister or skin breakdown.     Toenail Condition: Right toenails are  normal.     Left foot:     Protective Sensation: 4 sites tested.  4 sites sensed.     Skin integrity: Skin integrity normal. No ulcer, blister or skin breakdown.     Toenail Condition: Left toenails are normal.  Lymphadenopathy:     Cervical: No cervical adenopathy.  Skin:    General: Skin is warm and dry.     Capillary Refill: Capillary refill takes less than 2 seconds.  Neurological:     General: No focal deficit present.     Mental Status: She is alert and oriented to person, place, and time.  Psychiatric:        Mood and Affect: Mood normal.        Behavior: Behavior normal.        Thought Content: Thought content normal.    BP 121/64   Pulse 77   Temp (!) 97.3 F (36.3 C) (Temporal)   Resp 20   Ht 5' 3"  (1.6 m)   Wt 188 lb (85.3 kg)   SpO2 93%   BMI 33.30 kg/m   HgbA1c 6.0%       Assessment & Plan:   Debra Patton comes in today with chief complaint of Medical Management of Chronic Issues   Diagnosis and orders addressed:  1. Essential hypertension Low sodium diet - CBC with Differential/Platelet - CMP14+EGFR - lisinopril-hydrochlorothiazide (ZESTORETIC) 10-12.5 MG tablet; Take 1 tablet by mouth daily.  Dispense: 90 tablet; Refill: 1  2. Hyperlipidemia associated with type 2 diabetes mellitus ([facility]) Low fat diet - Lipid panel - rosuvastatin (CRESTOR) 10 MG tablet; Take 1 tablet (10 mg total) by mouth daily.  Dispense: 90 tablet; Refill: 1  3. Diabetes mellitus without complication ([place]) Continue to watch carb/sugar intake - Bayer DCA Hb A1c Waived - Microalbumin / creatinine urine ratio  4. BMI 32.0-32.9,adult Discussed diet and exercise for person with BMI >25 Will recheck weight in 3-6 months  5. Acute pain of right knee Rest Ice or heat Keep follow-up with ortho - celecoxib (CELEBREX) 400 MG capsule; Take 1 capsule (400 mg total) by mouth daily after breakfast.  Dispense: 30 capsule; Refill: 1   Labs pending Health Maintenance  reviewed Diet and exercise encouraged  Follow up plan: 6 months  Collene Leyden, FNP student   [place]vis Pretty, [place]

## 2022-09-01 LAB — LIPID PANEL
Chol/HDL Ratio: 3.4 ratio (ref 0.0–4.4)
Cholesterol, Total: 162 mg/dL (ref 100–199)
HDL: 48 mg/dL (ref >39)
LDL Chol Calc (NIH): 93 mg/dL (ref 0–99)
Triglycerides: 119 mg/dL (ref 0–149)
VLDL Cholesterol Cal: 21 mg/dL (ref 5–40)

## 2022-09-01 LAB — CBC WITH DIFFERENTIAL/PLATELET
Basophils Absolute: 0 10*3/uL (ref 0.0–0.2)
Basos: 0 %
EOS (ABSOLUTE): 0.3 10*3/uL (ref 0.0–0.4)
Eos: 3 %
Hematocrit: 36.4 % (ref 34.0–46.6)
Hemoglobin: 12.2 g/dL (ref 11.1–15.9)
Immature Grans (Abs): 0 10*3/uL (ref 0.0–0.1)
Immature Granulocytes: 0 %
Lymphocytes Absolute: 2.4 10*3/uL (ref 0.7–3.1)
Lymphs: 31 %
MCH: 27.9 pg (ref 26.6–33.0)
MCHC: 33.5 g/dL (ref 31.5–35.7)
MCV: 83 fL (ref 79–97)
Monocytes Absolute: 0.7 10*3/uL (ref 0.1–0.9)
Monocytes: 10 %
Neutrophils Absolute: 4.4 10*3/uL (ref 1.4–7.0)
Neutrophils: 56 %
Platelets: 331 10*3/uL (ref 150–450)
RBC: 4.38 x10E6/uL (ref 3.77–5.28)
RDW: 13.1 % (ref 11.7–15.4)
WBC: 7.8 10*3/uL (ref 3.4–10.8)

## 2022-09-01 LAB — CMP14+EGFR
ALT: 13 IU/L (ref 0–32)
AST: 11 IU/L (ref 0–40)
Albumin/Globulin Ratio: 1.7 (ref 1.2–2.2)
Albumin: 4.4 g/dL (ref 3.9–4.9)
Alkaline Phosphatase: 91 IU/L (ref 44–121)
BUN/Creatinine Ratio: 12 (ref 12–28)
BUN: 11 mg/dL (ref 8–27)
Bilirubin Total: <0.2 mg/dL (ref 0.0–1.2)
CO2: 23 mmol/L (ref 20–29)
Calcium: 9.4 mg/dL (ref 8.7–10.3)
Chloride: 99 mmol/L (ref 96–106)
Creatinine, Ser: 0.89 mg/dL (ref 0.57–1.00)
Globulin, Total: 2.6 g/dL (ref 1.5–4.5)
Glucose: 99 mg/dL (ref 70–99)
Potassium: 3.9 mmol/L (ref 3.5–5.2)
Sodium: 138 mmol/L (ref 134–144)
Total Protein: 7 g/dL (ref 6.0–8.5)
eGFR: 70 mL/min/{1.73_m2} (ref >59)

## 2022-09-02 DIAGNOSIS — Z780 Asymptomatic menopausal state: Secondary | ICD-10-CM | POA: Diagnosis not present

## 2022-09-02 LAB — MICROALBUMIN / CREATININE URINE RATIO
Creatinine, Urine: 119.1 mg/dL
Microalb/Creat Ratio: 5 mg/g creat (ref 0–29)
Microalbumin, Urine: 6 ug/mL

## 2022-09-16 ENCOUNTER — Encounter: Payer: Self-pay | Admitting: *Deleted

## 2022-09-23 DIAGNOSIS — E669 Obesity, unspecified: Secondary | ICD-10-CM | POA: Diagnosis not present

## 2022-09-23 DIAGNOSIS — N182 Chronic kidney disease, stage 2 (mild): Secondary | ICD-10-CM | POA: Diagnosis not present

## 2022-09-23 DIAGNOSIS — E785 Hyperlipidemia, unspecified: Secondary | ICD-10-CM | POA: Diagnosis not present

## 2022-09-23 DIAGNOSIS — Z6833 Body mass index (BMI) 33.0-33.9, adult: Secondary | ICD-10-CM | POA: Diagnosis not present

## 2022-09-23 DIAGNOSIS — Z008 Encounter for other general examination: Secondary | ICD-10-CM | POA: Diagnosis not present

## 2022-09-23 DIAGNOSIS — F17211 Nicotine dependence, cigarettes, in remission: Secondary | ICD-10-CM | POA: Diagnosis not present

## 2022-09-23 DIAGNOSIS — I129 Hypertensive chronic kidney disease with stage 1 through stage 4 chronic kidney disease, or unspecified chronic kidney disease: Secondary | ICD-10-CM | POA: Diagnosis not present

## 2022-10-14 DIAGNOSIS — E119 Type 2 diabetes mellitus without complications: Secondary | ICD-10-CM | POA: Diagnosis not present

## 2022-10-14 DIAGNOSIS — H40013 Open angle with borderline findings, low risk, bilateral: Secondary | ICD-10-CM | POA: Diagnosis not present

## 2022-10-14 LAB — HM DIABETES EYE EXAM

## 2022-12-31 ENCOUNTER — Other Ambulatory Visit (HOSPITAL_BASED_OUTPATIENT_CLINIC_OR_DEPARTMENT_OTHER): Payer: Self-pay | Admitting: Obstetrics & Gynecology

## 2023-02-15 ENCOUNTER — Encounter: Payer: Self-pay | Admitting: *Deleted

## 2023-02-15 ENCOUNTER — Encounter (HOSPITAL_BASED_OUTPATIENT_CLINIC_OR_DEPARTMENT_OTHER): Payer: Self-pay | Admitting: Obstetrics & Gynecology

## 2023-02-15 ENCOUNTER — Ambulatory Visit (HOSPITAL_BASED_OUTPATIENT_CLINIC_OR_DEPARTMENT_OTHER): Payer: No Typology Code available for payment source | Admitting: Obstetrics & Gynecology

## 2023-02-15 VITALS — BP 125/62 | HR 74 | Ht 63.0 in | Wt 185.2 lb

## 2023-02-15 DIAGNOSIS — G8929 Other chronic pain: Secondary | ICD-10-CM

## 2023-02-15 DIAGNOSIS — N852 Hypertrophy of uterus: Secondary | ICD-10-CM | POA: Diagnosis not present

## 2023-02-15 DIAGNOSIS — L280 Lichen simplex chronicus: Secondary | ICD-10-CM

## 2023-02-15 DIAGNOSIS — Z800 Family history of malignant neoplasm of digestive organs: Secondary | ICD-10-CM

## 2023-02-15 DIAGNOSIS — R1031 Right lower quadrant pain: Secondary | ICD-10-CM

## 2023-02-15 MED ORDER — MOMETASONE FUROATE 0.1 % EX OINT: TOPICAL_OINTMENT | CUTANEOUS | 3 refills | Status: DC

## 2023-02-15 NOTE — Progress Notes (Unsigned)
GYNECOLOGY  VISIT  CC:   vulvar recheck  HPI: 72 y.o. Single Black or [place] American female here for vulvar recheck after being seen last year and having a vulvar biopsy due to significant hypopigmentation of inner labia major.  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  She has been using mometasone 0.1% ointment topically twice weekly and is doing really well.  Has no itching at all.  Does sometimes check her vulva and thinks the skin appears about the same but really happy with improvement in symptoms.   Unrelated, health maintenance reviewed.  MMG up to date but does need colonoscopy.  Pt needs referral.  Had done at [facility] and recommendation was follow up 5 years.  Wants to do in [place].  When discussing colonoscopy, pt reports RLQ pain as well.  H/o fibroid uterus.  No vaginal bleeding.     Past Medical History:  Diagnosis Date   Allergy    Diabetes mellitus without complication    Glaucoma    History of acute pancreatitis    History of herpes zoster    Hyperlipidemia    Hypertension     MEDS:   Current Outpatient Medications on File Prior to Visit  Medication Sig Dispense Refill   acetaminophen (TYLENOL) 500 MG tablet Take 500 mg by mouth every 6 (six) hours as needed.     aspirin-sod bicarb-citric acid (ALKA-SELTZER) 325 MG TBEF tablet Take 325 mg by mouth every 6 (six) hours as needed.     celecoxib (CELEBREX) 400 MG capsule Take 1 capsule (400 mg total) by mouth daily after breakfast. 30 capsule 1   cetirizine (ZYRTEC) 10 MG tablet Take 1 tablet (10 mg total) by mouth daily. 30 tablet 5   diphenhydramine-acetaminophen (TYLENOL PM) 25-500 MG TABS tablet Take 1 tablet by mouth at bedtime as needed.     ibuprofen (ADVIL) 800 MG tablet Take 1 tablet (800 mg total) by mouth 3 (three) times daily. 21 tablet 0   lisinopril-hydrochlorothiazide (ZESTORETIC) 10-12.5 MG tablet Take 1 tablet by mouth daily. 90 tablet 1   mometasone (ELOCON) 0.1 % ointment APPLY TO  INNER LABIA TWICE WEEKLY. FOR EXAMPLE, ON MONDAYS AND THURSDAYS 45 g 0   No current facility-administered medications on file prior to visit.    ALLERGIES: Other and Ivp dye [iodinated contrast media]  SH:  single, non smoker  Review of Systems  Constitutional: Negative.   Genitourinary: Negative.     PHYSICAL EXAMINATION:    BP 125/62 (BP Location: Right Arm, Patient Position: Sitting, Cuff Size: Large)   Pulse 74   Ht 5\' 3"  (1.6 m) Comment: Reported  Wt 185 lb 3.2 oz (84 kg)   BMI 32.81 kg/m     General appearance: alert, cooperative and appears stated age Lymph:  no inguinal LAD noted  Pelvic: External genitalia:  inner labia majora hypopigmentation with some normal pigmented areas as well.  No skin thickening or ulcerations              Urethra:  normal appearing urethra with no masses, tenderness or lesions              Bartholins and Skenes: normal      Vagina:  atrophic but no lesions or discharge   Cervix: normal   Uterus: about 10 weeks, globular with more nodularity on right, findings c/w fibroids   Adnexa: no masses noted               Chaperone, Ina Homes, CMA,  was present for exam.  Assessment/Plan: 1. 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  Will continue mometasone twice weekly and avoidance of common irritants - mometasone (ELOCON) 0.1 % ointment; Apply topically twice weekly  Dispense: 45 g; Refill: 3  2. Family history of colon cancer - referral placed.  Pt desires to have done in [place] at [facility] facility. - Ambulatory referral to Gastroenterology  3. Enlarged uterus - US PELVIC COMPLETE WITH TRANSVAGINAL; Future  4. Chronic RLQ pain

## 2023-03-02 ENCOUNTER — Ambulatory Visit: Payer: No Typology Code available for payment source | Admitting: Nurse Practitioner

## 2023-03-05 ENCOUNTER — Ambulatory Visit (HOSPITAL_BASED_OUTPATIENT_CLINIC_OR_DEPARTMENT_OTHER)
Admission: RE | Admit: 2023-03-05 | Discharge: 2023-03-05 | Disposition: A | Discharge status: Home / Self Care | Payer: No Typology Code available for payment source | Source: Ambulatory Visit | Attending: Obstetrics & Gynecology | Admitting: Obstetrics & Gynecology

## 2023-03-05 DIAGNOSIS — N852 Hypertrophy of uterus: Secondary | ICD-10-CM | POA: Diagnosis not present

## 2023-03-05 DIAGNOSIS — D259 Leiomyoma of uterus, unspecified: Secondary | ICD-10-CM | POA: Diagnosis not present

## 2023-03-08 ENCOUNTER — Ambulatory Visit (INDEPENDENT_AMBULATORY_CARE_PROVIDER_SITE_OTHER): Payer: No Typology Code available for payment source

## 2023-03-08 VITALS — Ht 63.0 in | Wt 185.0 lb

## 2023-03-08 DIAGNOSIS — Z0000 Encounter for general adult medical examination without abnormal findings: Secondary | ICD-10-CM

## 2023-03-08 DIAGNOSIS — Z1231 Encounter for screening mammogram for malignant neoplasm of breast: Secondary | ICD-10-CM

## 2023-03-08 NOTE — Progress Notes (Signed)
Subjective:   Debra Patton is a 72 y.o. female who presents for Medicare Annual (Subsequent) preventive examination. I connected with  THU BAGGETT on 03/08/23 by a audio enabled telemedicine application and verified that I am speaking with the correct person using two identifiers.  Patient Location: Home  Provider Location: Home Office  I discussed the limitations of evaluation and management by telemedicine. The patient expressed understanding and agreed to proceed.  Review of Systems     Cardiac Risk Factors include: advanced age (>46men, >58 women);hypertension;dyslipidemia     Objective:    Today's Vitals   03/08/23 1532  Weight: 185 lb (83.9 kg)  Height: 5\' 3"  (1.6 m)   Body mass index is 32.77 kg/m.     03/08/2023    3:37 PM 03/02/2022    3:34 PM 02/28/2021    3:21 PM 04/28/2020   10:50 AM 05/01/2019    3:11 PM 04/28/2018    8:53 AM  Advanced Directives  Does Patient Have a Medical Advance Directive? No No No No No No  Would patient like information on creating a medical advance directive? No - Patient declined No - Patient declined No - Patient declined No - Patient declined No - Patient declined Yes (MAU/Ambulatory/Procedural Areas - Information given)    Current Medications (verified) Outpatient Encounter Medications as of 03/08/2023  Medication Sig   acetaminophen (TYLENOL) 500 MG tablet Take 500 mg by mouth every 6 (six) hours as needed.   aspirin-sod bicarb-citric acid (ALKA-SELTZER) 325 MG TBEF tablet Take 325 mg by mouth every 6 (six) hours as needed.   celecoxib (CELEBREX) 400 MG capsule Take 1 capsule (400 mg total) by mouth daily after breakfast.   cetirizine (ZYRTEC) 10 MG tablet Take 1 tablet (10 mg total) by mouth daily.   diphenhydramine-acetaminophen (TYLENOL PM) 25-500 MG TABS tablet Take 1 tablet by mouth at bedtime as needed.   ibuprofen (ADVIL) 800 MG tablet Take 1 tablet (800 mg total) by mouth 3 (three) times daily.    lisinopril-hydrochlorothiazide (ZESTORETIC) 10-12.5 MG tablet Take 1 tablet by mouth daily.   mometasone (ELOCON) 0.1 % ointment Apply topically twice weekly   No facility-administered encounter medications on file as of 03/08/2023.    Allergies (verified) Other and Ivp dye [iodinated contrast media]   History: Past Medical History:  Diagnosis Date   Allergy    Diabetes mellitus without complication (HCC)    Glaucoma    History of acute pancreatitis    History of herpes zoster    Hyperlipidemia    Hypertension    Past Surgical History:  Procedure Laterality Date   CARPAL TUNNEL RELEASE     CHOLECYSTECTOMY     NECK SURGERY     TUBAL LIGATION     TUMOR REMOVAL Left 2016   Neck    Family History  Problem Relation Age of Onset   Hypertension Mother    Diabetes Mother    Stroke Mother    Vision loss Mother    Asthma Mother    Alcohol abuse Father    Cancer Father    Stroke Father    Hypertension Sister    Diabetes Sister    Hypertension Sister    Early death Brother    Cancer Brother        Brain tumor    Cancer Brother    Early death Brother    Diabetes Brother    Hypertension Brother    Hypertension Brother    Cancer Brother  Diabetes Brother    Hypertension Brother    Cancer Brother    Diabetes Brother    Hypertension Brother    Vision loss Brother    Stroke Brother    Breast cancer Neg Hx    Social History   Socioeconomic History   Marital status: Single    Spouse name: Not on file   Number of children: 2   Years of education: GED   Highest education level: GED or equivalent  Occupational History   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  Tobacco Use   Smoking status: Former    Types: Cigarettes    Quit date: 11/09/2004    Years since quitting: 18.3   Smokeless tobacco: Never  Vaping Use   Vaping Use: Never used  Substance and Sexual Activity   Alcohol use: Not Currently   Drug use: Never   Sexual activity: Not  Currently  Other Topics Concern   Not on file  Social History Narrative   Not on file   Social Determinants of Health   Financial Resource Strain: Low Risk  (03/08/2023)   Overall Financial Resource Strain (CARDIA)    Difficulty of Paying Living Expenses: Not hard at all  Food Insecurity: No Food Insecurity (03/08/2023)   Hunger Vital Sign    Worried About Running Out of Food in the Last Year: Never true    Ran Out of Food in the Last Year: Never true  Transportation Needs: No Transportation Needs (03/08/2023)   PRAPARE - Administrator, Civil Service (Medical): No    Lack of Transportation (Non-Medical): No  Physical Activity: Insufficiently Active (03/08/2023)   Exercise Vital Sign    Days of Exercise per Week: 3 days    Minutes of Exercise per Session: 30 min  Stress: No Stress Concern Present (03/08/2023)   [employer] of Occupational Health - Occupational Stress Questionnaire    Feeling of Stress : Not at all  Social Connections: Moderately Isolated (03/08/2023)   Social Connection and Isolation Panel [NHANES]    Frequency of Communication with Friends and Family: More than three times a week    Frequency of Social Gatherings with Friends and Family: More than three times a week    Attends Religious Services: More than 4 times per year    Active Member of [employer] or Organizations: No    Attends Banker Meetings: Never    Marital Status: Widowed    Tobacco Counseling Counseling given: Not Answered   Clinical Intake:  Pre-visit preparation completed: Yes  Pain : No/denies pain     Nutritional Risks: None Diabetes: No  How often do you need to have someone help you when you read instructions, pamphlets, or other written materials from your doctor or pharmacy?: 1 - Never  Diabetic?no   Interpreter Needed?: No  Information entered by :: Renie Ora, LPN   Activities of Daily Living    03/08/2023    3:38 PM  In your present state of  health, do you have any difficulty performing the following activities:  Hearing? 0  Vision? 0  Difficulty concentrating or making decisions? 0  Walking or climbing stairs? 0  Dressing or bathing? 0  Doing errands, shopping? 0  Preparing Food and eating ? N  Using the Toilet? N  In the past six months, have you accidently leaked urine? N  Do you have problems with loss of bowel control? N  Managing your Medications? N  Managing your Finances? N  Housekeeping or managing your Housekeeping? N    Patient Care Team: Bennie Pierini, FNP as PCP - General (Family Medicine)  Indicate any recent Medical Services you may have received from other than [facility] providers in the past year (date may be approximate).     Assessment:   This is a routine wellness examination for Cor[place].  Hearing/Vision screen Vision Screening - Comments:: Wears rx glasses - up to date with routine eye exams with  [facility]  Dietary issues and exercise activities discussed: Current Exercise Habits: Home exercise routine, Type of exercise: walking, Time (Minutes): 30, Frequency (Times/Week): 3, Weekly Exercise (Minutes/Week): 90, Intensity: Mild, Exercise limited by: None identified   Goals Addressed             This Visit's Progress    Have 3 meals a day   On track      Depression Screen    03/08/2023    3:36 PM 02/15/2023   11:54 AM 08/31/2022    2:43 PM 08/10/2022   11:22 AM 03/02/2022    3:29 PM 02/23/2022    2:35 PM 02/09/2022   11:15 AM  PHQ 2/9 Scores  PHQ - 2 Score 0 0 0 0 0 0 0  PHQ- 9 Score   0 0 0 0     Fall Risk    03/08/2023    3:34 PM 08/31/2022    2:43 PM 03/02/2022    3:36 PM 02/23/2022    2:35 PM 08/25/2021    2:35 PM  Fall Risk   Falls in the past year? 0 0 0 0 0  Number falls in past yr: 0  0    Injury with Fall? 0  0    Risk for fall due to : No Fall Risks  No Fall Risks    Follow up Falls prevention discussed  Falls prevention discussed      FALL RISK  PREVENTION PERTAINING TO THE HOME:  Any stairs in or around the home? Yes  If so, are there any without handrails? No  Home free of loose throw rugs in walkways, pet beds, electrical cords, etc? Yes  Adequate lighting in your home to reduce risk of falls? Yes   ASSISTIVE DEVICES UTILIZED TO PREVENT FALLS:  Life alert? Yes  Use of a cane, walker or w/c? Yes  Grab bars in the bathroom? No  Shower chair or bench in shower? No  Elevated toilet seat or a handicapped toilet? No       04/28/2018    8:56 AM  MMSE - Mini Mental State Exam  Orientation to time 5  Orientation to Place 5  Registration 3  Attention/ Calculation 5  Recall 3  Language- name 2 objects 2  Language- repeat 1  Language- follow 3 step command 3  Language- read & follow direction 1  Write a sentence 1  Copy design 1  Total score 30        03/08/2023    3:38 PM 02/28/2021    3:24 PM 02/28/2021    3:20 PM 05/01/2019    3:13 PM  6CIT Screen  What Year? 0 points  0 points 0 points  What month? 0 points  0 points 0 points  What time? 0 points  0 points 0 points  Count back from 20 0 points  0 points 0 points  Months in reverse 0 points  0 points 0 points  Repeat phrase 0 points 2 points  2 points 2 points  Total Score 0 points  2 points 2 points    Immunizations Immunization History  Administered Date(s) Administered   Fluad Quad(high Dose 65+) 09/12/2021, 08/31/2022   Moderna Sars-Covid-2 Vaccination 06/02/2020, 06/30/2020   Tdap 04/27/2017   Zoster Recombinat (Shingrix) 09/26/2021, 01/05/2022    TDAP status: Up to date  Flu Vaccine status: Up to date  Pneumococcal vaccine status: Due, Education has been provided regarding the importance of this vaccine. Advised may receive this vaccine at local pharmacy or Health Dept. Aware to provide a copy of the vaccination record if obtained from local pharmacy or Health Dept. Verbalized acceptance and understanding.  Covid-19 vaccine status: Completed  vaccines  Qualifies for Shingles Vaccine? Yes   Zostavax completed No   Shingrix Completed?: No.    Education has been provided regarding the importance of this vaccine. Patient has been advised to call insurance company to determine out of pocket expense if they have not yet received this vaccine. Advised may also receive vaccine at local pharmacy or Health Dept. Verbalized acceptance and understanding.  Screening Tests Health Maintenance  Topic Date Due   Pneumonia Vaccine 50+ Years old (1 of 1 - PCV) Never done   COVID-19 Vaccine (3 - 2023-24 season) 07/10/2022   HEMOGLOBIN A1C  03/02/2023   INFLUENZA VACCINE  06/10/2023   Diabetic kidney evaluation - eGFR measurement  09/01/2023   Diabetic kidney evaluation - Urine ACR  09/01/2023   FOOT EXAM  09/01/2023   OPHTHALMOLOGY EXAM  10/15/2023   COLONOSCOPY (Pts 45-34yrs Insurance coverage will need to be confirmed)  03/02/2024   Medicare Annual Wellness (AWV)  03/07/2024   MAMMOGRAM  03/11/2024   DTaP/Tdap/Td (2 - Td or Tdap) 04/28/2027   DEXA SCAN  Completed   Hepatitis C Screening  Completed   Zoster Vaccines- Shingrix  Completed   HPV VACCINES  Aged Out    Health Maintenance  Health Maintenance Due  Topic Date Due   Pneumonia Vaccine 23+ Years old (1 of 1 - PCV) Never done   COVID-19 Vaccine (3 - 2023-24 season) 07/10/2022   HEMOGLOBIN A1C  03/02/2023    Colorectal cancer screening: Type of screening: Colonoscopy. Completed 03/02/2014. Repeat every 10 years  Mammogram status: Completed 03/12/2023. Repeat every year  Bone Density status: Ordered 08/31/2022. Pt provided with contact info and advised to call to schedule appt.  Lung Cancer Screening: (Low Dose CT Chest recommended if Age 68-80 years, 30 pack-year currently smoking OR have quit w/in 15years.) does not qualify.   Lung Cancer Screening Referral: n/a  Additional Screening:  Hepatitis C Screening: does not qualify; Completed 08/27/2020  Vision Screening:  Recommended annual ophthalmology exams for early detection of glaucoma and other disorders of the eye. Is the patient up to date with their annual eye exam?  Yes  Who is the provider or what is the name of the office in which the patient attends annual eye exams? [facility]  If pt is not established with a provider, would they like to be referred to a provider to establish care? No .   Dental Screening: Recommended annual dental exams for proper oral hygiene  Community Resource Referral / Chronic Care Management: CRR required this visit?  No   CCM required this visit?  No      Plan:     I have personally reviewed and noted the following in the patient's chart:   Medical and social history Use of alcohol, tobacco or illicit drugs  Current medications and supplements including opioid prescriptions. Patient is not currently taking opioid prescriptions. Functional ability and status Nutritional status Physical activity Advanced directives List of other physicians Hospitalizations, surgeries, and ER visits in previous 12 months Vitals Screenings to include cognitive, depression, and falls Referrals and appointments  In addition, I have reviewed and discussed with patient certain preventive protocols, quality metrics, and best practice recommendations. A written personalized care plan for preventive services as well as general preventive health recommendations were provided to patient.     Lorrene Reid, LPN   1/61/0960   Nurse Notes: Due pneumonia vaccine

## 2023-03-08 NOTE — Patient Instructions (Signed)
Debra Patton , Thank you for taking time to come for your Medicare Wellness Visit. I appreciate your ongoing commitment to your health goals. Please review the following plan we discussed and let me know if I can assist you in the future.   These are the goals we discussed:  Goals      Have 3 meals a day     Increase physical activity        This is a list of the screening recommended for you and due dates:  Health Maintenance  Topic Date Due   Pneumonia Vaccine (1 of 1 - PCV) Never done   COVID-19 Vaccine (3 - 2023-24 season) 07/10/2022   Hemoglobin A1C  03/02/2023   Flu Shot  06/10/2023   Yearly kidney function blood test for diabetes  09/01/2023   Yearly kidney health urinalysis for diabetes  09/01/2023   Complete foot exam   09/01/2023   Eye exam for diabetics  10/15/2023   Colon Cancer Screening  03/02/2024   Medicare Annual Wellness Visit  03/07/2024   Mammogram  03/11/2024   DTaP/Tdap/Td vaccine (2 - Td or Tdap) 04/28/2027   DEXA scan (bone density measurement)  Completed   Hepatitis C Screening: USPSTF Recommendation to screen - Ages 47-79 yo.  Completed   Zoster (Shingles) Vaccine  Completed   HPV Vaccine  Aged Out    Advanced directives: Advance directive discussed with you today. I have provided a copy for you to complete at home and have notarized. Once this is complete please bring a copy in to our office so we can scan it into your chart.   Conditions/risks identified: Aim for 30 minutes of exercise or brisk walking, 6-8 glasses of water, and 5 servings of fruits and vegetables each day.   Next appointment: Follow up in one year for your annual wellness visit    Preventive Care 65 Years and Older, Female Preventive care refers to lifestyle choices and visits with your health care provider that can promote health and wellness. What does preventive care include? A yearly physical exam. This is also called an annual well check. Dental exams once or twice a  year. Routine eye exams. Ask your health care provider how often you should have your eyes checked. Personal lifestyle choices, including: Daily care of your teeth and gums. Regular physical activity. Eating a healthy diet. Avoiding tobacco and drug use. Limiting alcohol use. Practicing safe sex. Taking low-dose aspirin every day. Taking vitamin and mineral supplements as recommended by your health care provider. What happens during an annual well check? The services and screenings done by your health care provider during your annual well check will depend on your age, overall health, lifestyle risk factors, and family history of disease. Counseling  Your health care provider may ask you questions about your: Alcohol use. Tobacco use. Drug use. Emotional well-being. Home and relationship well-being. Sexual activity. Eating habits. History of falls. Memory and ability to understand (cognition). Work and work Astronomer. Reproductive health. Screening  You may have the following tests or measurements: Height, weight, and BMI. Blood pressure. Lipid and cholesterol levels. These may be checked every 5 years, or more frequently if you are over 71 years old. Skin check. Lung cancer screening. You may have this screening every year starting at age 71 if you have a 30-pack-year history of smoking and currently smoke or have quit within the past 15 years. Fecal occult blood test (FOBT) of the stool. You may have this test  every year starting at age 71. Flexible sigmoidoscopy or colonoscopy. You may have a sigmoidoscopy every 5 years or a colonoscopy every 10 years starting at age 71. Hepatitis C blood test. Hepatitis B blood test. Sexually transmitted disease (STD) testing. Diabetes screening. This is done by checking your blood sugar (glucose) after you have not eaten for a while (fasting). You may have this done every 1-3 years. Bone density scan. This is done to screen for  osteoporosis. You may have this done starting at age 71. Mammogram. This may be done every 1-2 years. Talk to your health care provider about how often you should have regular mammograms. Talk with your health care provider about your test results, treatment options, and if necessary, the need for more tests. Vaccines  Your health care provider may recommend certain vaccines, such as: Influenza vaccine. This is recommended every year. Tetanus, diphtheria, and acellular pertussis (Tdap, Td) vaccine. You may need a Td booster every 10 years. Zoster vaccine. You may need this after age 71. Pneumococcal 13-valent conjugate (PCV13) vaccine. One dose is recommended after age 33. Pneumococcal polysaccharide (PPSV23) vaccine. One dose is recommended after age 71. Talk to your health care provider about which screenings and vaccines you need and how often you need them. This information is not intended to replace advice given to you by your health care provider. Make sure you discuss any questions you have with your health care provider. Document Released: 11/22/2015 Document Revised: 07/15/2016 Document Reviewed: 08/27/2015 Elsevier Interactive Patient Education  2017 [place] Prevention in the Home Falls can cause injuries. They can happen to people of all ages. There are many things you can do to make your home safe and to help prevent falls. What can I do on the outside of my home? Regularly fix the edges of walkways and driveways and fix any cracks. Remove anything that might make you trip as you walk through a door, such as a raised step or threshold. Trim any bushes or trees on the path to your home. Use bright outdoor lighting. Clear any walking paths of anything that might make someone trip, such as rocks or tools. Regularly check to see if handrails are loose or broken. Make sure that both sides of any steps have handrails. Any raised decks and porches should have guardrails on  the edges. Have any leaves, snow, or ice cleared regularly. Use sand or salt on walking paths during winter. Clean up any spills in your garage right away. This includes oil or grease spills. What can I do in the bathroom? Use night lights. Install grab bars by the toilet and in the tub and shower. Do not use towel bars as grab bars. Use non-skid mats or decals in the tub or shower. If you need to sit down in the shower, use a plastic, non-slip stool. Keep the floor dry. Clean up any water that spills on the floor as soon as it happens. Remove soap buildup in the tub or shower regularly. Attach bath mats securely with double-sided non-slip rug tape. Do not have throw rugs and other things on the floor that can make you trip. What can I do in the bedroom? Use night lights. Make sure that you have a light by your bed that is easy to reach. Do not use any sheets or blankets that are too big for your bed. They should not hang down onto the floor. Have a firm chair that has side arms. You can use  this for support while you get dressed. Do not have throw rugs and other things on the floor that can make you trip. What can I do in the kitchen? Clean up any spills right away. Avoid walking on wet floors. Keep items that you use a lot in easy-to-reach places. If you need to reach something above you, use a strong step stool that has a grab bar. Keep electrical cords out of the way. Do not use floor polish or wax that makes floors slippery. If you must use wax, use non-skid floor wax. Do not have throw rugs and other things on the floor that can make you trip. What can I do with my stairs? Do not leave any items on the stairs. Make sure that there are handrails on both sides of the stairs and use them. Fix handrails that are broken or loose. Make sure that handrails are as long as the stairways. Check any carpeting to make sure that it is firmly attached to the stairs. Fix any carpet that is loose  or worn. Avoid having throw rugs at the top or bottom of the stairs. If you do have throw rugs, attach them to the floor with carpet tape. Make sure that you have a light switch at the top of the stairs and the bottom of the stairs. If you do not have them, ask someone to add them for you. What else can I do to help prevent falls? Wear shoes that: Do not have high heels. Have rubber bottoms. Are comfortable and fit you well. Are closed at the toe. Do not wear sandals. If you use a stepladder: Make sure that it is fully opened. Do not climb a closed stepladder. Make sure that both sides of the stepladder are locked into place. Ask someone to hold it for you, if possible. Clearly mark and make sure that you can see: Any grab bars or handrails. First and last steps. Where the edge of each step is. Use tools that help you move around (mobility aids) if they are needed. These include: Canes. Walkers. Scooters. Crutches. Turn on the lights when you go into a dark area. Replace any light bulbs as soon as they burn out. Set up your furniture so you have a clear path. Avoid moving your furniture around. If any of your floors are uneven, fix them. If there are any pets around you, be aware of where they are. Review your medicines with your doctor. Some medicines can make you feel dizzy. This can increase your chance of falling. Ask your doctor what other things that you can do to help prevent falls. This information is not intended to replace advice given to you by your health care provider. Make sure you discuss any questions you have with your health care provider. Document Released: 08/22/2009 Document Revised: 04/02/2016 Document Reviewed: 11/30/2014 Elsevier Interactive Patient Education  2017 [employer].

## 2023-03-09 ENCOUNTER — Encounter: Payer: Self-pay | Admitting: Nurse Practitioner

## 2023-03-09 ENCOUNTER — Ambulatory Visit: Payer: No Typology Code available for payment source | Admitting: Nurse Practitioner

## 2023-03-09 VITALS — BP 111/64 | HR 63 | Temp 96.7°F | Resp 20 | Ht 63.0 in | Wt 184.0 lb

## 2023-03-09 DIAGNOSIS — E785 Hyperlipidemia, unspecified: Secondary | ICD-10-CM

## 2023-03-09 DIAGNOSIS — E119 Type 2 diabetes mellitus without complications: Secondary | ICD-10-CM

## 2023-03-09 DIAGNOSIS — E1169 Type 2 diabetes mellitus with other specified complication: Secondary | ICD-10-CM | POA: Diagnosis not present

## 2023-03-09 DIAGNOSIS — Z6832 Body mass index (BMI) 32.0-32.9, adult: Secondary | ICD-10-CM

## 2023-03-09 DIAGNOSIS — I10 Essential (primary) hypertension: Secondary | ICD-10-CM

## 2023-03-09 LAB — BAYER DCA HB A1C WAIVED: HB A1C (BAYER DCA - WAIVED): 6.3 % — ABNORMAL HIGH (ref 4.8–5.6)

## 2023-03-09 MED ORDER — LISINOPRIL-HYDROCHLOROTHIAZIDE 10-12.5 MG PO TABS: 1.0000 | ORAL_TABLET | Freq: Every day | ORAL | 1 refills | Status: DC

## 2023-03-09 NOTE — Progress Notes (Signed)
Subjective:    Patient ID: Debra Patton, female    DOB: 05-Oct-1951, 72 y.o.   MRN: [id number]   Chief Complaint: medical management of chronic issues     HPI:  Debra Patton is a 72 y.o. who identifies as a female who was assigned female at birth.   Social history: Lives with: by herself Work history: retired   Water engineer in today for follow up of the following chronic medical issues:  1. Essential hypertension No c/o chest pain, sob or headache. Does not check blood pressure at home. BP Readings from Last 3 Encounters:  02/15/23 125/62  08/31/22 121/64  08/10/22 (!) 144/70     2. Hyperlipidemia associated with type 2 diabetes mellitus (HCC) Does try to watch diet but does no exercise at all. Lab Results  Component Value Date   CHOL 162 08/31/2022   HDL 48 08/31/2022   LDLCALC 93 08/31/2022   TRIG 119 08/31/2022   CHOLHDL 3.4 08/31/2022     3. Diabetes mellitus without complication (HCC) She doe sot check her blood sugars at home. Lab Results  Component Value Date   HGBA1C 6.0 (H) 08/31/2022     4. BMI 32.0-32.9,adult No recent weight changes Wt Readings from Last 3 Encounters:  03/09/23 184 lb (83.5 kg)  03/08/23 185 lb (83.9 kg)  02/15/23 185 lb 3.2 oz (84 kg)   BMI Readings from Last 3 Encounters:  03/09/23 32.59 kg/m  03/08/23 32.77 kg/m  02/15/23 32.81 kg/m     New complaints: None today  Allergies  Allergen Reactions   Other Anaphylaxis   Ivp Dye [Iodinated Contrast Media]    Outpatient Encounter Medications as of 03/09/2023  Medication Sig   acetaminophen (TYLENOL) 500 MG tablet Take 500 mg by mouth every 6 (six) hours as needed.   aspirin-sod bicarb-citric acid (ALKA-SELTZER) 325 MG TBEF tablet Take 325 mg by mouth every 6 (six) hours as needed.   celecoxib (CELEBREX) 400 MG capsule Take 1 capsule (400 mg total) by mouth daily after breakfast.   cetirizine (ZYRTEC) 10 MG tablet Take 1 tablet (10 mg total) by mouth daily.    diphenhydramine-acetaminophen (TYLENOL PM) 25-500 MG TABS tablet Take 1 tablet by mouth at bedtime as needed.   ibuprofen (ADVIL) 800 MG tablet Take 1 tablet (800 mg total) by mouth 3 (three) times daily.   lisinopril-hydrochlorothiazide (ZESTORETIC) 10-12.5 MG tablet Take 1 tablet by mouth daily.   mometasone (ELOCON) 0.1 % ointment Apply topically twice weekly   No facility-administered encounter medications on file as of 03/09/2023.    Past Surgical History:  Procedure Laterality Date   CARPAL TUNNEL RELEASE     CHOLECYSTECTOMY     NECK SURGERY     TUBAL LIGATION     TUMOR REMOVAL Left 2016   Neck     Family History  Problem Relation Age of Onset   Hypertension Mother    Diabetes Mother    Stroke Mother    Vision loss Mother    Asthma Mother    Alcohol abuse Father    Cancer Father    Stroke Father    Hypertension Sister    Diabetes Sister    Hypertension Sister    Early death Brother    Cancer Brother        Brain tumor    Cancer Brother    Early death Brother    Diabetes Brother    Hypertension Brother    Hypertension Brother    Cancer  Brother    Diabetes Brother    Hypertension Brother    Cancer Brother    Diabetes Brother    Hypertension Brother    Vision loss Brother    Stroke Brother    Breast cancer Neg Hx       Controlled substance contract: n/a      Review of Systems  Constitutional:  Negative for diaphoresis.  Eyes:  Negative for pain.  Respiratory:  Negative for shortness of breath.   Cardiovascular:  Negative for chest pain, palpitations and leg swelling.  Gastrointestinal:  Negative for abdominal pain.  Endocrine: Negative for polydipsia.  Skin:  Negative for rash.  Neurological:  Negative for dizziness, weakness and headaches.  Hematological:  Does not bruise/bleed easily.  All other systems reviewed and are negative.      Objective:   Physical Exam Vitals and nursing note reviewed.  Constitutional:      General: She is not in  acute distress.    Appearance: Normal appearance. She is well-developed.  HENT:     Head: Normocephalic.     Right Ear: Tympanic membrane normal.     Left Ear: Tympanic membrane normal.     Nose: Nose normal.     Mouth/Throat:     Mouth: Mucous membranes are moist.  Eyes:     Pupils: Pupils are equal, round, and reactive to light.  Neck:     Vascular: No carotid bruit or JVD.  Cardiovascular:     Rate and Rhythm: Normal rate and regular rhythm.     Heart sounds: Normal heart sounds.  Pulmonary:     Effort: Pulmonary effort is normal. No respiratory distress.     Breath sounds: Normal breath sounds. No wheezing or rales.  Chest:     Chest wall: No tenderness.  Abdominal:     General: Bowel sounds are normal. There is no distension or abdominal bruit.     Palpations: Abdomen is soft. There is no hepatomegaly, splenomegaly, mass or pulsatile mass.     Tenderness: There is no abdominal tenderness.  Musculoskeletal:        General: Normal range of motion.     Cervical back: Normal range of motion and neck supple.  Lymphadenopathy:     Cervical: No cervical adenopathy.  Skin:    General: Skin is warm and dry.  Neurological:     Mental Status: She is alert and oriented to person, place, and time.     Deep Tendon Reflexes: Reflexes are normal and symmetric.  Psychiatric:        Behavior: Behavior normal.        Thought Content: Thought content normal.        Judgment: Judgment normal.    BP 111/64   Pulse 63   Temp (!) 96.7 F (35.9 C) (Temporal)   Resp 20   Ht 5\' 3"  (1.6 m)   Wt 184 lb (83.5 kg)   SpO2 92%   BMI 32.59 kg/m   Hgba1c 6.3%      Assessment & Plan:   Debra Patton in today with chief complaint of No chief complaint on file.   1. Essential hypertension Low sodium diet - CBC with Differential/Platelet - CMP14+EGFR - lisinopril-hydrochlorothiazide (ZESTORETIC) 10-12.5 MG tablet; Take 1 tablet by mouth daily.  Dispense: 90 tablet; Refill: 1  2.  Hyperlipidemia associated with type 2 diabetes mellitus (HCC) Low fat diet - Lipid panel  3. Diabetes mellitus without complication (HCC) Continue to watch carbs -  Bayer DCA Hb A1c Waived  4. BMI 32.0-32.9,adult Discussed diet and exercise for person with BMI >25 Will recheck weight in 3-6 months     The above assessment and management plan was discussed with the patient. The patient verbalized understanding of and has agreed to the management plan. Patient is aware to call the clinic if symptoms persist or worsen. Patient is aware when to return to the clinic for a follow-up visit. Patient educated on when it is appropriate to go to the emergency department.   Mary-Margaret Daphine Deutscher, FNP

## 2023-03-09 NOTE — Patient Instructions (Signed)

## 2023-03-10 LAB — CMP14+EGFR
ALT: 13 IU/L (ref 0–32)
AST: 13 IU/L (ref 0–40)
Albumin/Globulin Ratio: 1.4 (ref 1.2–2.2)
Albumin: 4 g/dL (ref 3.9–4.9)
Alkaline Phosphatase: 89 IU/L (ref 44–121)
BUN/Creatinine Ratio: 16 (ref 12–28)
BUN: 13 mg/dL (ref 8–27)
Bilirubin Total: 0.3 mg/dL (ref 0.0–1.2)
CO2: 21 mmol/L (ref 20–29)
Calcium: 9.3 mg/dL (ref 8.7–10.3)
Chloride: 105 mmol/L (ref 96–106)
Creatinine, Ser: 0.83 mg/dL (ref 0.57–1.00)
Globulin, Total: 2.8 g/dL (ref 1.5–4.5)
Glucose: 84 mg/dL (ref 70–99)
Potassium: 4 mmol/L (ref 3.5–5.2)
Sodium: 143 mmol/L (ref 134–144)
Total Protein: 6.8 g/dL (ref 6.0–8.5)
eGFR: 76 mL/min/{1.73_m2} (ref >59)

## 2023-03-10 LAB — CBC WITH DIFFERENTIAL/PLATELET
Basophils Absolute: 0 10*3/uL (ref 0.0–0.2)
Basos: 0 %
EOS (ABSOLUTE): 0.5 10*3/uL — ABNORMAL HIGH (ref 0.0–0.4)
Eos: 8 %
Hematocrit: 35.5 % (ref 34.0–46.6)
Hemoglobin: 11.5 g/dL (ref 11.1–15.9)
Immature Grans (Abs): 0 10*3/uL (ref 0.0–0.1)
Immature Granulocytes: 0 %
Lymphocytes Absolute: 2 10*3/uL (ref 0.7–3.1)
Lymphs: 35 %
MCH: 27.1 pg (ref 26.6–33.0)
MCHC: 32.4 g/dL (ref 31.5–35.7)
MCV: 84 fL (ref 79–97)
Monocytes Absolute: 0.5 10*3/uL (ref 0.1–0.9)
Monocytes: 8 %
Neutrophils Absolute: 2.9 10*3/uL (ref 1.4–7.0)
Neutrophils: 49 %
Platelets: 349 10*3/uL (ref 150–450)
RBC: 4.25 x10E6/uL (ref 3.77–5.28)
RDW: 13.1 % (ref 11.7–15.4)
WBC: 5.8 10*3/uL (ref 3.4–10.8)

## 2023-03-10 LAB — LIPID PANEL
Chol/HDL Ratio: 4.5 ratio — ABNORMAL HIGH (ref 0.0–4.4)
Cholesterol, Total: 195 mg/dL (ref 100–199)
HDL: 43 mg/dL (ref >39)
LDL Chol Calc (NIH): 131 mg/dL — ABNORMAL HIGH (ref 0–99)
Triglycerides: 114 mg/dL (ref 0–149)
VLDL Cholesterol Cal: 21 mg/dL (ref 5–40)

## 2023-03-22 ENCOUNTER — Inpatient Hospital Stay: Admission: RE | Admit: 2023-03-22 | Payer: No Typology Code available for payment source | Source: Ambulatory Visit

## 2023-03-23 ENCOUNTER — Ambulatory Visit
Admission: RE | Admit: 2023-03-23 | Discharge: 2023-03-23 | Disposition: A | Discharge status: Home / Self Care | Payer: No Typology Code available for payment source | Source: Ambulatory Visit | Attending: Nurse Practitioner | Admitting: Nurse Practitioner

## 2023-03-23 DIAGNOSIS — Z1231 Encounter for screening mammogram for malignant neoplasm of breast: Secondary | ICD-10-CM

## 2023-04-23 DIAGNOSIS — H40013 Open angle with borderline findings, low risk, bilateral: Secondary | ICD-10-CM | POA: Diagnosis not present

## 2023-04-23 DIAGNOSIS — H2513 Age-related nuclear cataract, bilateral: Secondary | ICD-10-CM | POA: Diagnosis not present

## 2023-04-23 DIAGNOSIS — H524 Presbyopia: Secondary | ICD-10-CM | POA: Diagnosis not present

## 2023-04-23 DIAGNOSIS — H52223 Regular astigmatism, bilateral: Secondary | ICD-10-CM | POA: Diagnosis not present

## 2023-04-23 DIAGNOSIS — H43823 Vitreomacular adhesion, bilateral: Secondary | ICD-10-CM | POA: Diagnosis not present

## 2023-04-23 DIAGNOSIS — E119 Type 2 diabetes mellitus without complications: Secondary | ICD-10-CM | POA: Diagnosis not present

## 2023-07-20 DIAGNOSIS — E669 Obesity, unspecified: Secondary | ICD-10-CM | POA: Diagnosis not present

## 2023-07-20 DIAGNOSIS — N182 Chronic kidney disease, stage 2 (mild): Secondary | ICD-10-CM | POA: Diagnosis not present

## 2023-07-20 DIAGNOSIS — Z008 Encounter for other general examination: Secondary | ICD-10-CM | POA: Diagnosis not present

## 2023-07-20 DIAGNOSIS — Z6832 Body mass index (BMI) 32.0-32.9, adult: Secondary | ICD-10-CM | POA: Diagnosis not present

## 2023-07-20 DIAGNOSIS — R7303 Prediabetes: Secondary | ICD-10-CM | POA: Diagnosis not present

## 2023-08-17 ENCOUNTER — Encounter: Payer: Self-pay | Admitting: *Deleted

## 2023-09-07 ENCOUNTER — Ambulatory Visit: Payer: No Typology Code available for payment source | Admitting: Nurse Practitioner

## 2023-09-20 ENCOUNTER — Encounter: Payer: Self-pay | Admitting: Nurse Practitioner

## 2023-09-20 ENCOUNTER — Ambulatory Visit (INDEPENDENT_AMBULATORY_CARE_PROVIDER_SITE_OTHER): Payer: No Typology Code available for payment source | Admitting: Nurse Practitioner

## 2023-09-20 VITALS — BP 130/73 | HR 64 | Temp 97.2°F | Resp 20 | Ht 63.0 in | Wt 178.0 lb

## 2023-09-20 DIAGNOSIS — E1169 Type 2 diabetes mellitus with other specified complication: Secondary | ICD-10-CM

## 2023-09-20 DIAGNOSIS — I10 Essential (primary) hypertension: Secondary | ICD-10-CM

## 2023-09-20 DIAGNOSIS — Z6832 Body mass index (BMI) 32.0-32.9, adult: Secondary | ICD-10-CM | POA: Diagnosis not present

## 2023-09-20 DIAGNOSIS — E119 Type 2 diabetes mellitus without complications: Secondary | ICD-10-CM

## 2023-09-20 DIAGNOSIS — Z23 Encounter for immunization: Secondary | ICD-10-CM

## 2023-09-20 DIAGNOSIS — E785 Hyperlipidemia, unspecified: Secondary | ICD-10-CM

## 2023-09-20 LAB — BAYER DCA HB A1C WAIVED: HB A1C (BAYER DCA - WAIVED): 6 % — ABNORMAL HIGH (ref 4.8–5.6)

## 2023-09-20 MED ORDER — LISINOPRIL-HYDROCHLOROTHIAZIDE 10-12.5 MG PO TABS: 1.0000 | ORAL_TABLET | Freq: Every day | ORAL | 1 refills | Status: DC

## 2023-09-20 NOTE — Progress Notes (Signed)
Subjective:    Patient ID: Debra Patton, female    DOB: 16-Mar-1951, 72 y.o.   MRN: [id number]   Chief Complaint: medical management of chronic issues     HPI:  Debra Patton is a 72 y.o. who identifies as a female who was assigned female at birth.   Social history: Lives with: by herself- family checks on her daily Work history: retired   Water engineer in today for follow up of the following chronic medical issues:  1. Essential hypertension No c/o chest pain, sob  or headache. Does not check blood pressure at home. BP Readings from Last 3 Encounters:  03/09/23 111/64  02/15/23 125/62  08/31/22 121/64     2. Diet-controlled diabetes mellitus (HCC) Does not check blood sugars at home. Lab Results  Component Value Date   HGBA1C 6.3 (H) 03/09/2023     3. Hyperlipidemia associated with type 2 diabetes mellitus (HCC) Does not watch diet and does no exercise. Cannot tolerate a ststin Lab Results  Component Value Date   CHOL 195 03/09/2023   HDL 43 03/09/2023   LDLCALC 131 (H) 03/09/2023   TRIG 114 03/09/2023   CHOLHDL 4.5 (H) 03/09/2023   The 10-year ASCVD risk score (Arnett DK, et al., 2019) is: 26%   4. BMI 32.0-32.9,adult No recent weight changes Wt Readings from Last 3 Encounters:  09/20/23 178 lb (80.7 kg)  03/09/23 184 lb (83.5 kg)  03/08/23 185 lb (83.9 kg)   BMI Readings from Last 3 Encounters:  09/20/23 31.53 kg/m  03/09/23 32.59 kg/m  03/08/23 32.77 kg/m      New complaints: None today  Allergies  Allergen Reactions   Other Anaphylaxis   Ivp Dye [Iodinated Contrast Media]    Outpatient Encounter Medications as of 09/20/2023  Medication Sig   acetaminophen (TYLENOL) 500 MG tablet Take 500 mg by mouth every 6 (six) hours as needed.   aspirin-sod bicarb-citric acid (ALKA-SELTZER) 325 MG TBEF tablet Take 325 mg by mouth every 6 (six) hours as needed.   celecoxib (CELEBREX) 400 MG capsule Take 1 capsule (400 mg total) by mouth daily  after breakfast.   cetirizine (ZYRTEC) 10 MG tablet Take 1 tablet (10 mg total) by mouth daily.   diphenhydramine-acetaminophen (TYLENOL PM) 25-500 MG TABS tablet Take 1 tablet by mouth at bedtime as needed.   ibuprofen (ADVIL) 800 MG tablet Take 1 tablet (800 mg total) by mouth 3 (three) times daily.   lisinopril-hydrochlorothiazide (ZESTORETIC) 10-12.5 MG tablet Take 1 tablet by mouth daily.   mometasone (ELOCON) 0.1 % ointment Apply topically twice weekly   No facility-administered encounter medications on file as of 09/20/2023.    Past Surgical History:  Procedure Laterality Date   CARPAL TUNNEL RELEASE     CHOLECYSTECTOMY     NECK SURGERY     TUBAL LIGATION     TUMOR REMOVAL Left 2016   Neck     Family History  Problem Relation Age of Onset   Hypertension Mother    Diabetes Mother    Stroke Mother    Vision loss Mother    Asthma Mother    Alcohol abuse Father    Cancer Father    Stroke Father    Hypertension Sister    Diabetes Sister    Hypertension Sister    Early death Brother    Cancer Brother        Brain tumor    Cancer Brother    Early death Brother  Diabetes Brother    Hypertension Brother    Hypertension Brother    Cancer Brother    Diabetes Brother    Hypertension Brother    Cancer Brother    Diabetes Brother    Hypertension Brother    Vision loss Brother    Stroke Brother    Breast cancer Neg Hx       Controlled substance contract: n/a     Review of Systems  Constitutional:  Negative for diaphoresis.  Eyes:  Negative for pain.  Respiratory:  Negative for shortness of breath.   Cardiovascular:  Negative for chest pain, palpitations and leg swelling.  Gastrointestinal:  Negative for abdominal pain.  Endocrine: Negative for polydipsia.  Skin:  Negative for rash.  Neurological:  Negative for dizziness, weakness and headaches.  Hematological:  Does not bruise/bleed easily.  All other systems reviewed and are negative.       Objective:   Physical Exam Vitals and nursing note reviewed.  Constitutional:      General: She is not in acute distress.    Appearance: Normal appearance. She is well-developed.  HENT:     Head: Normocephalic.     Right Ear: Tympanic membrane normal.     Left Ear: Tympanic membrane normal.     Nose: Nose normal.     Mouth/Throat:     Mouth: Mucous membranes are moist.  Eyes:     Pupils: Pupils are equal, round, and reactive to light.  Neck:     Vascular: No carotid bruit or JVD.  Cardiovascular:     Rate and Rhythm: Normal rate and regular rhythm.     Heart sounds: Normal heart sounds.  Pulmonary:     Effort: Pulmonary effort is normal. No respiratory distress.     Breath sounds: Normal breath sounds. No wheezing or rales.  Chest:     Chest wall: No tenderness.  Abdominal:     General: Bowel sounds are normal. There is no distension or abdominal bruit.     Palpations: Abdomen is soft. There is no hepatomegaly, splenomegaly, mass or pulsatile mass.     Tenderness: There is no abdominal tenderness.  Musculoskeletal:        General: Normal range of motion.     Cervical back: Normal range of motion and neck supple.  Lymphadenopathy:     Cervical: No cervical adenopathy.  Skin:    General: Skin is warm and dry.  Neurological:     Mental Status: She is alert and oriented to person, place, and time.     Deep Tendon Reflexes: Reflexes are normal and symmetric.  Psychiatric:        Behavior: Behavior normal.        Thought Content: Thought content normal.        Judgment: Judgment normal.     BP 130/73   Pulse 64   Temp (!) 97.2 F (36.2 C) (Temporal)   Resp 20   Ht 5\' 3"  (1.6 m)   Wt 178 lb (80.7 kg)   SpO2 96%   BMI 31.53 kg/m   HGBA1c 6.0      Assessment & Plan:   Debra Patton comes in today with chief complaint of Medical Management of Chronic Issues   Diagnosis and orders addressed:  1. Essential hypertension Low sodium diet - CBC with  Differential/Platelet - CMP14+EGFR - lisinopril-hydrochlorothiazide (ZESTORETIC) 10-12.5 MG tablet; Take 1 tablet by mouth daily.  Dispense: 90 tablet; Refill: 1  2. Diet-controlled diabetes mellitus (HCC)  Continue to watch carbs in diet - Bayer DCA Hb A1c Waived - Microalbumin / creatinine urine ratio  3. Hyperlipidemia associated with type 2 diabetes mellitus (HCC) Low fat diet - Lipid panel  4. BMI 32.0-32.9,adult Discussed diet and exercise for person with BMI >25 Will recheck weight in 3-6 months    Labs pending Health Maintenance reviewed Diet and exercise encouraged  Follow up plan: 6 months   Mary-Margaret Daphine Deutscher, FNP

## 2023-09-20 NOTE — Patient Instructions (Signed)

## 2023-09-21 LAB — MICROALBUMIN / CREATININE URINE RATIO
Creatinine, Urine: 161.7 mg/dL
Microalb/Creat Ratio: 10 mg/g{creat} (ref 0–29)
Microalbumin, Urine: 15.7 ug/mL

## 2023-09-21 LAB — CBC WITH DIFFERENTIAL/PLATELET
Basophils Absolute: 0 10*3/uL (ref 0.0–0.2)
Basos: 0 %
EOS (ABSOLUTE): 0.4 10*3/uL (ref 0.0–0.4)
Eos: 6 %
Hematocrit: 39.3 % (ref 34.0–46.6)
Hemoglobin: 12.2 g/dL (ref 11.1–15.9)
Immature Grans (Abs): 0 10*3/uL (ref 0.0–0.1)
Immature Granulocytes: 0 %
Lymphocytes Absolute: 2.2 10*3/uL (ref 0.7–3.1)
Lymphs: 36 %
MCH: 26.8 pg (ref 26.6–33.0)
MCHC: 31 g/dL — ABNORMAL LOW (ref 31.5–35.7)
MCV: 86 fL (ref 79–97)
Monocytes Absolute: 0.5 10*3/uL (ref 0.1–0.9)
Monocytes: 7 %
Neutrophils Absolute: 3.1 10*3/uL (ref 1.4–7.0)
Neutrophils: 51 %
Platelets: 388 10*3/uL (ref 150–450)
RBC: 4.55 x10E6/uL (ref 3.77–5.28)
RDW: 12.9 % (ref 11.7–15.4)
WBC: 6.2 10*3/uL (ref 3.4–10.8)

## 2023-09-21 LAB — LIPID PANEL
Chol/HDL Ratio: 4.9 ratio — ABNORMAL HIGH (ref 0.0–4.4)
Cholesterol, Total: 220 mg/dL — ABNORMAL HIGH (ref 100–199)
HDL: 45 mg/dL (ref >39)
LDL Chol Calc (NIH): 145 mg/dL — ABNORMAL HIGH (ref 0–99)
Triglycerides: 165 mg/dL — ABNORMAL HIGH (ref 0–149)
VLDL Cholesterol Cal: 30 mg/dL (ref 5–40)

## 2023-09-21 LAB — CMP14+EGFR
ALT: 14 [IU]/L (ref 0–32)
AST: 15 [IU]/L (ref 0–40)
Albumin: 4.2 g/dL (ref 3.9–4.9)
Alkaline Phosphatase: 96 [IU]/L (ref 44–121)
BUN/Creatinine Ratio: 18 (ref 12–28)
BUN: 15 mg/dL (ref 8–27)
Bilirubin Total: <0.2 mg/dL (ref 0.0–1.2)
CO2: 20 mmol/L (ref 20–29)
Calcium: 9.7 mg/dL (ref 8.7–10.3)
Chloride: 104 mmol/L (ref 96–106)
Creatinine, Ser: 0.85 mg/dL (ref 0.57–1.00)
Globulin, Total: 3 g/dL (ref 1.5–4.5)
Glucose: 92 mg/dL (ref 70–99)
Potassium: 4.4 mmol/L (ref 3.5–5.2)
Sodium: 143 mmol/L (ref 134–144)
Total Protein: 7.2 g/dL (ref 6.0–8.5)
eGFR: 74 mL/min/{1.73_m2} (ref >59)

## 2023-09-21 MED ORDER — ROSUVASTATIN CALCIUM 10 MG PO TABS
10.0000 mg | ORAL_TABLET | Freq: Every day | ORAL | 1 refills | Status: DC
Start: 2023-09-21 — End: 2024-03-09

## 2023-09-21 NOTE — Addendum Note (Signed)
Addended by: Bennie Pierini on: 09/21/2023 01:07 PM   Modules accepted: Orders

## 2023-10-05 ENCOUNTER — Telehealth: Payer: Self-pay

## 2023-10-05 DIAGNOSIS — I10 Essential (primary) hypertension: Secondary | ICD-10-CM

## 2023-10-05 MED ORDER — LISINOPRIL-HYDROCHLOROTHIAZIDE 10-12.5 MG PO TABS: 1.0000 | ORAL_TABLET | ORAL | 1 refills | Status: DC

## 2023-10-05 NOTE — Telephone Encounter (Signed)
Copied from CRM [id number]. Topic: Clinical - Prescription Issue >> Oct 05, 2023  1:29 PM Clayton Bibles wrote: Reason for CRM: [facility] called and if there is a change in Zestoretic dose please send a new script to the Pharmacy. PT is stating she takes every other day. Please call [facility] at [phone]. Thanks

## 2023-10-05 NOTE — Telephone Encounter (Signed)
Call [facility] and let them know new prescription sent to [facility] in [place]

## 2023-10-05 NOTE — Addendum Note (Signed)
Addended by: Bennie Pierini on: 10/05/2023 03:59 PM   Modules accepted: Orders

## 2023-10-22 ENCOUNTER — Encounter: Payer: Self-pay | Admitting: Nurse Practitioner

## 2023-10-22 ENCOUNTER — Ambulatory Visit (INDEPENDENT_AMBULATORY_CARE_PROVIDER_SITE_OTHER): Payer: No Typology Code available for payment source | Admitting: Nurse Practitioner

## 2023-10-22 VITALS — BP 151/76 | HR 70 | Temp 97.4°F | Resp 20 | Ht 63.0 in | Wt 178.0 lb

## 2023-10-22 DIAGNOSIS — J3089 Other allergic rhinitis: Secondary | ICD-10-CM

## 2023-10-22 MED ORDER — PREDNISONE 20 MG PO TABS: 40.0000 mg | ORAL_TABLET | Freq: Every day | ORAL | 0 refills | Status: AC

## 2023-10-22 MED ORDER — FLUTICASONE PROPIONATE 50 MCG/ACT NA SUSP: 2.0000 | Freq: Every day | NASAL | 6 refills | Status: DC

## 2023-10-22 NOTE — Progress Notes (Signed)
Subjective:    Patient ID: Debra Patton, female    DOB: 1951/02/09, 72 y.o.   MRN: [id number]   Chief Complaint: Sinus Problem   Sinus Problem This is a new problem. The current episode started in the past 7 days. The problem has been waxing and waning since onset. There has been no fever. Her pain is at a severity of 0/10. Associated symptoms include congestion, coughing and sneezing. Pertinent negatives include no chills. Past treatments include nothing. The treatment provided no relief.    Patient Active Problem List   Diagnosis Date Noted   BMI 32.0-32.9,adult 04/05/2020   Carpal tunnel syndrome of right wrist 01/22/2020   Lichen simplex chronicus 04/06/2019   Diet-controlled diabetes mellitus (HCC) 02/23/2018   Essential hypertension 02/23/2018   Hyperlipidemia associated with type 2 diabetes mellitus (HCC) 02/23/2018       Review of Systems  Constitutional:  Negative for chills and fever.  HENT:  Positive for congestion and sneezing.   Respiratory:  Positive for cough.        Objective:   Physical Exam Vitals reviewed.  Constitutional:      Appearance: Normal appearance.  HENT:     Right Ear: Tympanic membrane normal.     Left Ear: Tympanic membrane normal.     Nose: Congestion and rhinorrhea present.     Mouth/Throat:     Pharynx: No oropharyngeal exudate or posterior oropharyngeal erythema.  Cardiovascular:     Rate and Rhythm: Normal rate and regular rhythm.     Heart sounds: Normal heart sounds.  Pulmonary:     Effort: Pulmonary effort is normal.     Breath sounds: Normal breath sounds.  Musculoskeletal:     Cervical back: Normal range of motion and neck supple.  Neurological:     Mental Status: She is alert and oriented to person, place, and time.  Psychiatric:        Mood and Affect: Mood normal.        Behavior: Behavior normal.    BP (!) 151/76 (BP Location: Left Arm)   Pulse 70   Temp (!) 97.4 F (36.3 C) (Temporal)   Resp 20   Ht  5\' 3"  (1.6 m)   Wt 178 lb (80.7 kg)   SpO2 99%   BMI 31.53 kg/m         Assessment & Plan:   Debra Patton in today with chief complaint of Sinus Problem   1. Non-seasonal allergic rhinitis, unspecified trigger (Primary) 1. Take meds as prescribed 2. Use a cool mist humidifier especially during the winter months and when heat has been humid. 3. Use saline nose sprays frequently 4. Saline irrigations of the nose can be very helpful if done frequently.  * 4X daily for 1 week*  * Use of a nettie pot can be helpful with this. Follow directions with this* 5. Drink plenty of fluids 6. Keep thermostat turn down low 7.For any cough or congestion- mi=ucinex if needed 8. For fever or aces or pains- take tylenol or ibuprofen appropriate for age and weight.  * for fevers greater than 101 orally you may alternate ibuprofen and tylenol every  3 hours.   Continue zyrtec nightly - fluticasone (FLONASE) 50 MCG/ACT nasal spray; Place 2 sprays into both nostrils daily.  Dispense: 16 g; Refill: 6 - predniSONE (DELTASONE) 20 MG tablet; Take 2 tablets (40 mg total) by mouth daily with breakfast for 5 days. 2 po daily for 5 days  Dispense:  10 tablet; Refill: 0    The above assessment and management plan was discussed with the patient. The patient verbalized understanding of and has agreed to the management plan. Patient is aware to call the clinic if symptoms persist or worsen. Patient is aware when to return to the clinic for a follow-up visit. Patient educated on when it is appropriate to go to the emergency department.   Mary-Margaret Daphine Deutscher, FNP

## 2023-10-22 NOTE — Patient Instructions (Signed)
1. Take meds as prescribed 2. Use a cool mist humidifier especially during the winter months and when heat has been humid. 3. Use saline nose sprays frequently 4. Saline irrigations of the nose can be very helpful if done frequently.  * 4X daily for 1 week*  * Use of a nettie pot can be helpful with this. Follow directions with this* 5. Drink plenty of fluids 6. Keep thermostat turn down low 7.For any cough or congestion- mucinex if needed 8. For fever or aces or pains- take tylenol or ibuprofen appropriate for age and weight.  * for fevers greater than 101 orally you may alternate ibuprofen and tylenol every  3 hours.    

## 2023-11-05 DIAGNOSIS — R0981 Nasal congestion: Secondary | ICD-10-CM | POA: Diagnosis not present

## 2023-11-05 DIAGNOSIS — Z6831 Body mass index (BMI) 31.0-31.9, adult: Secondary | ICD-10-CM | POA: Diagnosis not present

## 2023-11-05 DIAGNOSIS — R030 Elevated blood-pressure reading, without diagnosis of hypertension: Secondary | ICD-10-CM | POA: Diagnosis not present

## 2023-11-05 DIAGNOSIS — E669 Obesity, unspecified: Secondary | ICD-10-CM | POA: Diagnosis not present

## 2023-11-10 DIAGNOSIS — E7800 Pure hypercholesterolemia, unspecified: Secondary | ICD-10-CM | POA: Diagnosis not present

## 2023-11-10 DIAGNOSIS — E109 Type 1 diabetes mellitus without complications: Secondary | ICD-10-CM | POA: Diagnosis not present

## 2023-11-10 DIAGNOSIS — Z7984 Long term (current) use of oral hypoglycemic drugs: Secondary | ICD-10-CM | POA: Diagnosis not present

## 2023-11-10 DIAGNOSIS — Z79899 Other long term (current) drug therapy: Secondary | ICD-10-CM | POA: Diagnosis not present

## 2023-11-10 DIAGNOSIS — Z87891 Personal history of nicotine dependence: Secondary | ICD-10-CM | POA: Diagnosis not present

## 2023-11-10 DIAGNOSIS — I10 Essential (primary) hypertension: Secondary | ICD-10-CM | POA: Diagnosis not present

## 2023-11-10 DIAGNOSIS — Z20822 Contact with and (suspected) exposure to covid-19: Secondary | ICD-10-CM | POA: Diagnosis not present

## 2023-11-10 DIAGNOSIS — J069 Acute upper respiratory infection, unspecified: Secondary | ICD-10-CM | POA: Diagnosis not present

## 2023-11-10 DIAGNOSIS — R059 Cough, unspecified: Secondary | ICD-10-CM | POA: Diagnosis not present

## 2023-11-18 DIAGNOSIS — E669 Obesity, unspecified: Secondary | ICD-10-CM | POA: Diagnosis not present

## 2023-11-18 DIAGNOSIS — E1169 Type 2 diabetes mellitus with other specified complication: Secondary | ICD-10-CM | POA: Diagnosis not present

## 2023-11-18 DIAGNOSIS — I10 Essential (primary) hypertension: Secondary | ICD-10-CM | POA: Diagnosis not present

## 2023-11-18 DIAGNOSIS — H4010X0 Unspecified open-angle glaucoma, stage unspecified: Secondary | ICD-10-CM | POA: Diagnosis not present

## 2023-11-18 DIAGNOSIS — Z008 Encounter for other general examination: Secondary | ICD-10-CM | POA: Diagnosis not present

## 2023-11-18 DIAGNOSIS — Z6831 Body mass index (BMI) 31.0-31.9, adult: Secondary | ICD-10-CM | POA: Diagnosis not present

## 2023-11-18 DIAGNOSIS — E785 Hyperlipidemia, unspecified: Secondary | ICD-10-CM | POA: Diagnosis not present

## 2023-11-22 ENCOUNTER — Ambulatory Visit: Payer: No Typology Code available for payment source

## 2024-02-21 ENCOUNTER — Ambulatory Visit (HOSPITAL_BASED_OUTPATIENT_CLINIC_OR_DEPARTMENT_OTHER): Payer: No Typology Code available for payment source | Admitting: Obstetrics & Gynecology

## 2024-03-09 ENCOUNTER — Other Ambulatory Visit: Payer: Self-pay | Admitting: *Deleted

## 2024-03-09 MED ORDER — ROSUVASTATIN CALCIUM 10 MG PO TABS: 10.0000 mg | ORAL_TABLET | Freq: Every day | ORAL | 0 refills | Status: DC

## 2024-03-13 ENCOUNTER — Ambulatory Visit (INDEPENDENT_AMBULATORY_CARE_PROVIDER_SITE_OTHER): Payer: No Typology Code available for payment source

## 2024-03-13 VITALS — Ht 63.0 in | Wt 178.0 lb

## 2024-03-13 DIAGNOSIS — Z0000 Encounter for general adult medical examination without abnormal findings: Secondary | ICD-10-CM

## 2024-03-13 NOTE — Patient Instructions (Signed)
 Debra Patton , Thank you for taking time to come for your Medicare Wellness Visit. I appreciate your ongoing commitment to your health goals. Please review the following plan we discussed and let me know if I can assist you in the future.   Referrals/Orders/Follow-Ups/Clinician Recommendations: Aim for 30 minutes of exercise or brisk walking, 6-8 glasses of water, and 5 servings of fruits and vegetables each day.  This is a list of the screening recommended for you and due dates:  Health Maintenance  Topic Date Due   Pneumonia Vaccine (1 of 2 - PCV) Never done   COVID-19 Vaccine (3 - 2024-25 season) 07/11/2023   Complete foot exam   09/01/2023   Eye exam for diabetics  10/15/2023   Colon Cancer Screening  03/02/2024   Hemoglobin A1C  03/19/2024   Flu Shot  06/09/2024   Yearly kidney function blood test for diabetes  09/19/2024   Yearly kidney health urinalysis for diabetes  09/19/2024   Medicare Annual Wellness Visit  03/13/2025   Mammogram  03/22/2025   DTaP/Tdap/Td vaccine (2 - Td or Tdap) 04/28/2027   DEXA scan (bone density measurement)  Completed   Hepatitis C Screening  Completed   Zoster (Shingles) Vaccine  Completed   HPV Vaccine  Aged Out   Meningitis B Vaccine  Aged Out    Advanced directives: (ACP Link)Information on Advanced Care Planning can be found at [place]  Print production planner Health Care Directives Advance Health Care Directives. [email or web address]   Next Medicare Annual Wellness Visit scheduled for next year: Yes  Have you seen your provider in the last 6 months (3 months if uncontrolled diabetes)? Yes

## 2024-03-13 NOTE — Progress Notes (Signed)
 Subjective:   Debra Patton is a 73 y.o. who presents for a Medicare Wellness preventive visit.  Visit Complete: Virtual I connected with  Debra Patton on 03/13/24 by a audio enabled telemedicine application and verified that I am speaking with the correct person using two identifiers.  Patient Location: Home  Provider Location: Home Office  I discussed the limitations of evaluation and management by telemedicine. The patient expressed understanding and agreed to proceed.  Vital Signs: Because this visit was a virtual/telehealth visit, some criteria may be missing or patient reported. Any vitals not documented were not able to be obtained and vitals that have been documented are patient reported.  VideoDeclined- This patient declined Librarian, academic. Therefore the visit was completed with audio only.  Persons Participating in Visit: Patient.  AWV Questionnaire: No: Patient Medicare AWV questionnaire was not completed prior to this visit.  Cardiac Risk Factors include: advanced age (>68men, >65 women);dyslipidemia;hypertension;sedentary lifestyle     Objective:    Today's Vitals   03/13/24 0846  Weight: 178 lb (80.7 kg)  Height: 5\' 3"  (1.6 m)   Body mass index is 31.53 kg/m.     03/13/2024    9:21 AM 03/08/2023    3:37 PM 03/02/2022    3:34 PM 02/28/2021    3:21 PM 04/28/2020   10:50 AM 05/01/2019    3:11 PM 04/28/2018    8:53 AM  Advanced Directives  Does Patient Have a Medical Advance Directive? No No No No No No No  Would patient like information on creating a medical advance directive? Yes (MAU/Ambulatory/Procedural Areas - Information given) No - Patient declined No - Patient declined No - Patient declined No - Patient declined No - Patient declined Yes (MAU/Ambulatory/Procedural Areas - Information given)    Current Medications (verified) Outpatient Encounter Medications as of 03/13/2024  Medication Sig   acetaminophen (TYLENOL)  500 MG tablet Take 500 mg by mouth every 6 (six) hours as needed.   aspirin-sod bicarb-citric acid (ALKA-SELTZER) 325 MG TBEF tablet Take 325 mg by mouth every 6 (six) hours as needed.   celecoxib  (CELEBREX ) 400 MG capsule Take 1 capsule (400 mg total) by mouth daily after breakfast.   cetirizine  (ZYRTEC ) 10 MG tablet Take 1 tablet (10 mg total) by mouth daily.   diphenhydramine-acetaminophen (TYLENOL PM) 25-500 MG TABS tablet Take 1 tablet by mouth at bedtime as needed.   fluticasone  (FLONASE ) 50 MCG/ACT nasal spray Place 2 sprays into both nostrils daily.   ibuprofen  (ADVIL ) 800 MG tablet Take 1 tablet (800 mg total) by mouth 3 (three) times daily.   lisinopril -hydrochlorothiazide  (ZESTORETIC ) 10-12.5 MG tablet Take 1 tablet by mouth every other day.   mometasone  (ELOCON ) 0.1 % ointment Apply topically twice weekly   rosuvastatin  (CRESTOR ) 10 MG tablet Take 1 tablet (10 mg total) by mouth daily.   No facility-administered encounter medications on file as of 03/13/2024.    Allergies (verified) Other and Ivp dye [iodinated contrast media]   History: Past Medical History:  Diagnosis Date   Allergy    Diabetes mellitus without complication (HCC)    Glaucoma    History of acute pancreatitis    History of herpes zoster    Hyperlipidemia    Hypertension    Past Surgical History:  Procedure Laterality Date   CARPAL TUNNEL RELEASE     CHOLECYSTECTOMY     NECK SURGERY     TUBAL LIGATION     TUMOR REMOVAL Left 2016  Neck    Family History  Problem Relation Age of Onset   Hypertension Mother    Diabetes Mother    Stroke Mother    Vision loss Mother    Asthma Mother    Alcohol abuse Father    Cancer Father    Stroke Father    Hypertension Sister    Diabetes Sister    Hypertension Sister    Early death Brother    Cancer Brother        Brain tumor    Cancer Brother    Early death Brother    Diabetes Brother    Hypertension Brother    Hypertension Brother    Cancer Brother     Diabetes Brother    Hypertension Brother    Cancer Brother    Diabetes Brother    Hypertension Brother    Vision loss Brother    Stroke Brother    Breast cancer Neg Hx    Social History   Socioeconomic History   Marital status: Single    Spouse name: Not on file   Number of children: 2   Years of education: GED   Highest education level: GED or equivalent  Occupational History   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  Tobacco Use   Smoking status: Former    Current packs/day: 0.00    Types: Cigarettes    Quit date: 11/09/2004    Years since quitting: 19.3   Smokeless tobacco: Never  Vaping Use   Vaping status: Never Used  Substance and Sexual Activity   Alcohol use: Not Currently   Drug use: Never   Sexual activity: Not Currently  Other Topics Concern   Not on file  Social History Narrative   Not on file   Social Drivers of Health   Financial Resource Strain: Low Risk  (03/13/2024)   Overall Financial Resource Strain (CARDIA)    Difficulty of Paying Living Expenses: Not hard at all  Food Insecurity: Food Insecurity Present (03/13/2024)   Hunger Vital Sign    Worried About Running Out of Food in the Last Year: Sometimes true    Ran Out of Food in the Last Year: Never true  Transportation Needs: No Transportation Needs (03/13/2024)   PRAPARE - Administrator, Civil Service (Medical): No    Lack of Transportation (Non-Medical): No  Physical Activity: Inactive (03/13/2024)   Exercise Vital Sign    Days of Exercise per Week: 0 days    Minutes of Exercise per Session: 0 min  Stress: No Stress Concern Present (03/13/2024)   [employer] of Occupational Health - Occupational Stress Questionnaire    Feeling of Stress : Not at all  Social Connections: Moderately Isolated (03/13/2024)   Social Connection and Isolation Panel [NHANES]    Frequency of Communication with Friends and Family: More than three times a week    Frequency of Social  Gatherings with Friends and Family: More than three times a week    Attends Religious Services: More than 4 times per year    Active Member of [employer] or Organizations: No    Attends Banker Meetings: Never    Marital Status: Widowed    Tobacco Counseling Counseling given: Not Answered    Clinical Intake:  Pre-visit preparation completed: Yes  Pain : No/denies pain     Diabetes: Yes CBG done?: No Did pt. bring in CBG monitor from home?: No  Lab Results  Component Value  Date   HGBA1C 6.0 (H) 09/20/2023   HGBA1C 6.3 (H) 03/09/2023   HGBA1C 6.0 (H) 08/31/2022     How often do you need to have someone help you when you read instructions, pamphlets, or other written materials from your doctor or pharmacy?: 1 - Never  Interpreter Needed?: No  Information entered by :: Debra Cypress LPN   Activities of Daily Living     03/13/2024    9:21 AM  In your present state of health, do you have any difficulty performing the following activities:  Hearing? 0  Vision? 0  Difficulty concentrating or making decisions? 0  Walking or climbing stairs? 0  Dressing or bathing? 0  Doing errands, shopping? 0  Preparing Food and eating ? N  Using the Toilet? N  In the past six months, have you accidently leaked urine? N  Do you have problems with loss of bowel control? N  Managing your Medications? N  Managing your Finances? N  Housekeeping or managing your Housekeeping? N    Patient Care Team: Delfina Feller, FNP as PCP - General (Family Medicine)  Indicate any recent Medical Services you may have received from other than [facility] providers in the past year (date may be approximate).     Assessment:   This is a routine wellness examination for Debra Patton.  Hearing/Vision screen Hearing Screening - Comments:: Denies hearing difficulties   Vision Screening - Comments:: Wears rx glasses - up to date with routine eye exams with [facility]   Goals Addressed              This Visit's Progress    Have 3 meals a day   On track      Depression Screen     03/13/2024    9:19 AM 10/22/2023   11:08 AM 09/20/2023    2:14 PM 03/09/2023    2:46 PM 03/08/2023    3:36 PM 02/15/2023   11:54 AM 08/31/2022    2:43 PM  PHQ 2/9 Scores  PHQ - 2 Score 0 0 0 0 0 0 0  PHQ- 9 Score  0 0 0   0    Fall Risk     03/13/2024    9:20 AM 10/22/2023   11:08 AM 09/20/2023    2:13 PM 03/09/2023    2:46 PM 03/08/2023    3:34 PM  Fall Risk   Falls in the past year? 0 0 0 0 0  Number falls in past yr: 0    0  Injury with Fall? 0    0  Risk for fall due to : No Fall Risks    No Fall Risks  Follow up Falls prevention discussed;Education provided;Falls evaluation completed    Falls prevention discussed    MEDICARE RISK AT HOME:  Medicare Risk at Home Any stairs in or around the home?: No If so, are there any without handrails?: No Home free of loose throw rugs in walkways, pet beds, electrical cords, etc?: Yes Adequate lighting in your home to reduce risk of falls?: Yes Life alert?: No Use of a cane, walker or w/c?: No Grab bars in the bathroom?: Yes Shower chair or bench in shower?: No Elevated toilet seat or a handicapped toilet?: Yes  TIMED UP AND GO:  Was the test performed?  No  Cognitive Function: 6CIT completed    04/28/2018    8:56 AM  MMSE - Mini Mental State Exam  Orientation to time 5  Orientation to Place 5  Registration 3  Attention/ Calculation 5  Recall 3  Language- name 2 objects 2  Language- repeat 1  Language- follow 3 step command 3  Language- read & follow direction 1  Write a sentence 1  Copy design 1  Total score 30        03/13/2024    9:21 AM 03/08/2023    3:38 PM 02/28/2021    3:24 PM 02/28/2021    3:20 PM 05/01/2019    3:13 PM  6CIT Screen  What Year? 0 points 0 points  0 points 0 points  What month? 0 points 0 points  0 points 0 points  What time? 0 points 0 points  0 points 0 points  Count back from 20 0 points 0 points  0  points 0 points  Months in reverse 0 points 0 points  0 points 0 points  Repeat phrase 0 points 0 points 2 points 2 points 2 points  Total Score 0 points 0 points  2 points 2 points    Immunizations Immunization History  Administered Date(s) Administered   Fluad Quad(high Dose 65+) 09/12/2021, 08/31/2022   Fluad Trivalent(High Dose 65+) 09/20/2023   Moderna Sars-Covid-2 Vaccination 06/02/2020, 06/30/2020   Tdap 04/27/2017   Zoster Recombinant(Shingrix) 09/26/2021, 01/05/2022    Screening Tests Health Maintenance  Topic Date Due   Pneumonia Vaccine 65+ Years old (1 of 2 - PCV) Never done   COVID-19 Vaccine (3 - 2024-25 season) 07/11/2023   FOOT EXAM  09/01/2023   OPHTHALMOLOGY EXAM  10/15/2023   Colonoscopy  03/02/2024   HEMOGLOBIN A1C  03/19/2024   INFLUENZA VACCINE  06/09/2024   Diabetic kidney evaluation - eGFR measurement  09/19/2024   Diabetic kidney evaluation - Urine ACR  09/19/2024   Medicare Annual Wellness (AWV)  03/13/2025   MAMMOGRAM  03/22/2025   DTaP/Tdap/Td (2 - Td or Tdap) 04/28/2027   DEXA SCAN  Completed   Hepatitis C Screening  Completed   Zoster Vaccines- Shingrix  Completed   HPV VACCINES  Aged Out   Meningococcal B Vaccine  Aged Out    Health Maintenance  Health Maintenance Due  Topic Date Due   Pneumonia Vaccine 54+ Years old (1 of 2 - PCV) Never done   COVID-19 Vaccine (3 - 2024-25 season) 07/11/2023   FOOT EXAM  09/01/2023   OPHTHALMOLOGY EXAM  10/15/2023   Colonoscopy  03/02/2024   Health Maintenance Items Addressed: Declines colonoscopy and mammogram at this time   Additional Screening:  Vision Screening: Recommended annual ophthalmology exams for early detection of glaucoma and other disorders of the eye.  Dental Screening: Recommended annual dental exams for proper oral hygiene  Community Resource Referral / Chronic Care Management: CRR required this visit?  No   CCM required this visit?  No     Plan:     I have personally  reviewed and noted the following in the patient's chart:   Medical and social history Use of alcohol, tobacco or illicit drugs  Current medications and supplements including opioid prescriptions. Patient is not currently taking opioid prescriptions. Functional ability and status Nutritional status Physical activity Advanced directives List of other physicians Hospitalizations, surgeries, and ER visits in previous 12 months Vitals Screenings to include cognitive, depression, and falls Referrals and appointments  In addition, I have reviewed and discussed with patient certain preventive protocols, quality metrics, and best practice recommendations. A written personalized care plan for preventive services as well as general preventive health recommendations were provided to patient.  Debra Patton, [place]   06/10/9561   After Visit Summary: (MyChart) Due to this being a telephonic visit, the after visit summary with patients personalized plan was offered to patient via MyChart   Notes: Nothing significant to report at this time.

## 2024-03-20 ENCOUNTER — Ambulatory Visit (INDEPENDENT_AMBULATORY_CARE_PROVIDER_SITE_OTHER): Payer: No Typology Code available for payment source | Admitting: Nurse Practitioner

## 2024-03-20 ENCOUNTER — Encounter: Payer: Self-pay | Admitting: Nurse Practitioner

## 2024-03-20 VITALS — BP 149/71 | HR 75 | Temp 98.4°F | Ht 63.0 in | Wt 179.0 lb

## 2024-03-20 DIAGNOSIS — I10 Essential (primary) hypertension: Secondary | ICD-10-CM

## 2024-03-20 DIAGNOSIS — Z6832 Body mass index (BMI) 32.0-32.9, adult: Secondary | ICD-10-CM | POA: Diagnosis not present

## 2024-03-20 DIAGNOSIS — E119 Type 2 diabetes mellitus without complications: Secondary | ICD-10-CM | POA: Diagnosis not present

## 2024-03-20 DIAGNOSIS — E785 Hyperlipidemia, unspecified: Secondary | ICD-10-CM | POA: Diagnosis not present

## 2024-03-20 DIAGNOSIS — E1169 Type 2 diabetes mellitus with other specified complication: Secondary | ICD-10-CM | POA: Diagnosis not present

## 2024-03-20 LAB — BAYER DCA HB A1C WAIVED: HB A1C (BAYER DCA - WAIVED): 5.8 % — ABNORMAL HIGH (ref 4.8–5.6)

## 2024-03-20 MED ORDER — LISINOPRIL-HYDROCHLOROTHIAZIDE 10-12.5 MG PO TABS: 1.0000 | ORAL_TABLET | ORAL | 1 refills | Status: DC

## 2024-03-20 MED ORDER — ROSUVASTATIN CALCIUM 10 MG PO TABS: 10.0000 mg | ORAL_TABLET | Freq: Every day | ORAL | 1 refills | Status: DC

## 2024-03-20 NOTE — Patient Instructions (Signed)

## 2024-03-20 NOTE — Progress Notes (Signed)
 Subjective:    Patient ID: Debra Patton, female    DOB: 03/27/1951, 73 y.o.   MRN: [id number]   Chief Complaint: medical management of chronic issues     HPI:  Debra Patton is a 73 y.o. who identifies as a female who was assigned female at birth.   Social history: Lives with: by herself- family checks on her daily Work history: retired   Water engineer in today for follow up of the following chronic medical issues:  1. Essential hypertension No c/o chest pain, sob  or headache. Does not check blood pressure at home. BP Readings from Last 3 Encounters:  10/22/23 (!) 151/76  09/20/23 130/73  03/09/23 111/64     2. Diet-controlled diabetes mellitus (HCC) Does not check blood sugars at home. Lab Results  Component Value Date   HGBA1C 6.0 (H) 09/20/2023     3. Hyperlipidemia associated with type 2 diabetes mellitus (HCC) Does not watch diet and does no exercise. Is on crestor  10mg  daily., but she doe snot take it daily. Lab Results  Component Value Date   CHOL 220 (H) 09/20/2023   HDL 45 09/20/2023   LDLCALC 145 (H) 09/20/2023   TRIG 165 (H) 09/20/2023   CHOLHDL 4.9 (H) 09/20/2023   The 10-year ASCVD risk score (Arnett DK, et al., 2019) is: 36.9%   4. BMI 32.0-32.9,adult No recent weight changes  Wt Readings from Last 3 Encounters:  03/20/24 179 lb (81.2 kg)  03/13/24 178 lb (80.7 kg)  10/22/23 178 lb (80.7 kg)   BMI Readings from Last 3 Encounters:  03/20/24 31.71 kg/m  03/13/24 31.53 kg/m  10/22/23 31.53 kg/m       New complaints: None today  Allergies  Allergen Reactions   Other Anaphylaxis   Ivp Dye [Iodinated Contrast Media]    Outpatient Encounter Medications as of 03/20/2024  Medication Sig   acetaminophen (TYLENOL) 500 MG tablet Take 500 mg by mouth every 6 (six) hours as needed.   aspirin-sod bicarb-citric acid (ALKA-SELTZER) 325 MG TBEF tablet Take 325 mg by mouth every 6 (six) hours as needed.   celecoxib  (CELEBREX ) 400 MG  capsule Take 1 capsule (400 mg total) by mouth daily after breakfast.   cetirizine  (ZYRTEC ) 10 MG tablet Take 1 tablet (10 mg total) by mouth daily.   diphenhydramine-acetaminophen (TYLENOL PM) 25-500 MG TABS tablet Take 1 tablet by mouth at bedtime as needed.   fluticasone  (FLONASE ) 50 MCG/ACT nasal spray Place 2 sprays into both nostrils daily.   ibuprofen  (ADVIL ) 800 MG tablet Take 1 tablet (800 mg total) by mouth 3 (three) times daily.   lisinopril -hydrochlorothiazide  (ZESTORETIC ) 10-12.5 MG tablet Take 1 tablet by mouth every other day.   mometasone  (ELOCON ) 0.1 % ointment Apply topically twice weekly   rosuvastatin  (CRESTOR ) 10 MG tablet Take 1 tablet (10 mg total) by mouth daily.   No facility-administered encounter medications on file as of 03/20/2024.    Past Surgical History:  Procedure Laterality Date   CARPAL TUNNEL RELEASE     CHOLECYSTECTOMY     NECK SURGERY     TUBAL LIGATION     TUMOR REMOVAL Left 2016   Neck     Family History  Problem Relation Age of Onset   Hypertension Mother    Diabetes Mother    Stroke Mother    Vision loss Mother    Asthma Mother    Alcohol abuse Father    Cancer Father    Stroke Father  Hypertension Sister    Diabetes Sister    Hypertension Sister    Early death Brother    Cancer Brother        Brain tumor    Cancer Brother    Early death Brother    Diabetes Brother    Hypertension Brother    Hypertension Brother    Cancer Brother    Diabetes Brother    Hypertension Brother    Cancer Brother    Diabetes Brother    Hypertension Brother    Vision loss Brother    Stroke Brother    Breast cancer Neg Hx       Controlled substance contract: n/a     Review of Systems  Constitutional:  Negative for diaphoresis.  Eyes:  Negative for pain.  Respiratory:  Negative for shortness of breath.   Cardiovascular:  Negative for chest pain, palpitations and leg swelling.  Gastrointestinal:  Negative for abdominal pain.   Endocrine: Negative for polydipsia.  Skin:  Negative for rash.  Neurological:  Negative for dizziness, weakness and headaches.  Hematological:  Does not bruise/bleed easily.  All other systems reviewed and are negative.      Objective:   Physical Exam Vitals and nursing note reviewed.  Constitutional:      General: She is not in acute distress.    Appearance: Normal appearance. She is well-developed.  HENT:     Head: Normocephalic.     Right Ear: Tympanic membrane normal.     Left Ear: Tympanic membrane normal.     Nose: Nose normal.     Mouth/Throat:     Mouth: Mucous membranes are moist.  Eyes:     Pupils: Pupils are equal, round, and reactive to light.  Neck:     Vascular: No carotid bruit or JVD.  Cardiovascular:     Rate and Rhythm: Normal rate and regular rhythm.     Heart sounds: Normal heart sounds.  Pulmonary:     Effort: Pulmonary effort is normal. No respiratory distress.     Breath sounds: Normal breath sounds. No wheezing or rales.  Chest:     Chest wall: No tenderness.  Abdominal:     General: Bowel sounds are normal. There is no distension or abdominal bruit.     Palpations: Abdomen is soft. There is no hepatomegaly, splenomegaly, mass or pulsatile mass.     Tenderness: There is no abdominal tenderness.  Musculoskeletal:        General: Normal range of motion.     Cervical back: Normal range of motion and neck supple.  Lymphadenopathy:     Cervical: No cervical adenopathy.  Skin:    General: Skin is warm and dry.  Neurological:     Mental Status: She is alert and oriented to person, place, and time.     Deep Tendon Reflexes: Reflexes are normal and symmetric.  Psychiatric:        Behavior: Behavior normal.        Thought Content: Thought content normal.        Judgment: Judgment normal.     BP (!) 149/71   Pulse 75   Temp 98.4 F (36.9 C) (Temporal)   Ht 5\' 3"  (1.6 m)   Wt 179 lb (81.2 kg)   SpO2 98%   BMI 31.71 kg/m    HGBA1c  5.8      Assessment & Plan:   Debra Patton comes in today with chief complaint of medical management of chronic issues  Diagnosis and orders addressed:  1. Essential hypertension Low sodium diet - CBC with Differential/Platelet - CMP14+EGFR - lisinopril -hydrochlorothiazide  (ZESTORETIC ) 10-12.5 MG tablet; Take 1 tablet by mouth daily.  Dispense: 90 tablet; Refill: 1  2. Diet-controlled diabetes mellitus (HCC) Continue to watch carbs in diet - Bayer DCA Hb A1c Waived - Microalbumin / creatinine urine ratio  3. Hyperlipidemia associated with type 2 diabetes mellitus (HCC) Low fat diet - Lipid panel  4. BMI 32.0-32.9,adult Discussed diet and exercise for person with BMI >25 Will recheck weight in 3-6 months    Labs pending Health Maintenance reviewed Diet and exercise encouraged  Follow up plan: 6 months   Mary-Margaret Gaylyn Keas, FNP

## 2024-03-21 ENCOUNTER — Ambulatory Visit: Payer: Self-pay | Admitting: Nurse Practitioner

## 2024-03-21 LAB — CBC WITH DIFFERENTIAL/PLATELET
Basophils Absolute: 0 10*3/uL (ref 0.0–0.2)
Basos: 0 %
EOS (ABSOLUTE): 0.4 10*3/uL (ref 0.0–0.4)
Eos: 6 %
Hematocrit: 36.1 % (ref 34.0–46.6)
Hemoglobin: 12 g/dL (ref 11.1–15.9)
Immature Grans (Abs): 0 10*3/uL (ref 0.0–0.1)
Immature Granulocytes: 0 %
Lymphocytes Absolute: 2 10*3/uL (ref 0.7–3.1)
Lymphs: 32 %
MCH: 28.1 pg (ref 26.6–33.0)
MCHC: 33.2 g/dL (ref 31.5–35.7)
MCV: 85 fL (ref 79–97)
Monocytes Absolute: 0.4 10*3/uL (ref 0.1–0.9)
Monocytes: 6 %
Neutrophils Absolute: 3.5 10*3/uL (ref 1.4–7.0)
Neutrophils: 56 %
Platelets: 390 10*3/uL (ref 150–450)
RBC: 4.27 x10E6/uL (ref 3.77–5.28)
RDW: 13.2 % (ref 11.7–15.4)
WBC: 6.3 10*3/uL (ref 3.4–10.8)

## 2024-03-21 LAB — CMP14+EGFR
ALT: 11 IU/L (ref 0–32)
AST: 12 IU/L (ref 0–40)
Albumin: 4.2 g/dL (ref 3.8–4.8)
Alkaline Phosphatase: 93 IU/L (ref 44–121)
BUN/Creatinine Ratio: 20 (ref 12–28)
BUN: 16 mg/dL (ref 8–27)
Bilirubin Total: <0.2 mg/dL (ref 0.0–1.2)
CO2: 21 mmol/L (ref 20–29)
Calcium: 9.7 mg/dL (ref 8.7–10.3)
Chloride: 105 mmol/L (ref 96–106)
Creatinine, Ser: 0.82 mg/dL (ref 0.57–1.00)
Globulin, Total: 2.9 g/dL (ref 1.5–4.5)
Glucose: 99 mg/dL (ref 70–99)
Potassium: 4.1 mmol/L (ref 3.5–5.2)
Sodium: 141 mmol/L (ref 134–144)
Total Protein: 7.1 g/dL (ref 6.0–8.5)
eGFR: 76 mL/min/{1.73_m2} (ref >59)

## 2024-03-21 LAB — LIPID PANEL
Chol/HDL Ratio: 3 ratio (ref 0.0–4.4)
Cholesterol, Total: 148 mg/dL (ref 100–199)
HDL: 49 mg/dL (ref >39)
LDL Chol Calc (NIH): 78 mg/dL (ref 0–99)
Triglycerides: 120 mg/dL (ref 0–149)
VLDL Cholesterol Cal: 21 mg/dL (ref 5–40)

## 2024-03-27 ENCOUNTER — Other Ambulatory Visit (HOSPITAL_BASED_OUTPATIENT_CLINIC_OR_DEPARTMENT_OTHER): Payer: Self-pay | Admitting: Obstetrics & Gynecology

## 2024-03-27 DIAGNOSIS — L280 Lichen simplex chronicus: Secondary | ICD-10-CM

## 2024-03-29 ENCOUNTER — Emergency Department (HOSPITAL_COMMUNITY)
Admission: EM | Admit: 2024-03-29 | Discharge: 2024-03-29 | Disposition: A | Discharge status: Home / Self Care | Attending: Emergency Medicine | Admitting: Emergency Medicine

## 2024-03-29 ENCOUNTER — Other Ambulatory Visit: Payer: Self-pay

## 2024-03-29 DIAGNOSIS — E119 Type 2 diabetes mellitus without complications: Secondary | ICD-10-CM | POA: Diagnosis not present

## 2024-03-29 DIAGNOSIS — Z7982 Long term (current) use of aspirin: Secondary | ICD-10-CM | POA: Diagnosis not present

## 2024-03-29 DIAGNOSIS — Z79899 Other long term (current) drug therapy: Secondary | ICD-10-CM | POA: Diagnosis not present

## 2024-03-29 DIAGNOSIS — X58XXXA Exposure to other specified factors, initial encounter: Secondary | ICD-10-CM | POA: Diagnosis not present

## 2024-03-29 DIAGNOSIS — I10 Essential (primary) hypertension: Secondary | ICD-10-CM | POA: Diagnosis not present

## 2024-03-29 DIAGNOSIS — S199XXA Unspecified injury of neck, initial encounter: Secondary | ICD-10-CM | POA: Diagnosis present

## 2024-03-29 DIAGNOSIS — T148XXA Other injury of unspecified body region, initial encounter: Secondary | ICD-10-CM

## 2024-03-29 DIAGNOSIS — S161XXA Strain of muscle, fascia and tendon at neck level, initial encounter: Secondary | ICD-10-CM | POA: Insufficient documentation

## 2024-03-29 MED ORDER — METHOCARBAMOL 500 MG PO TABS
1000.0000 mg | ORAL_TABLET | Freq: Once | ORAL | Status: AC
  Administered 2024-03-29: 1000 mg via ORAL
  Filled 2024-03-29: qty 2

## 2024-03-29 MED ORDER — NAPROXEN 500 MG PO TABS: 500.0000 mg | ORAL_TABLET | Freq: Two times a day (BID) | ORAL | 0 refills | Status: DC

## 2024-03-29 MED ORDER — NAPROXEN 250 MG PO TABS
500.0000 mg | ORAL_TABLET | Freq: Once | ORAL | Status: AC
  Administered 2024-03-29: 500 mg via ORAL
  Filled 2024-03-29: qty 2

## 2024-03-29 MED ORDER — LIDOCAINE 5 % EX PTCH
1.0000 | MEDICATED_PATCH | CUTANEOUS | Status: DC
  Administered 2024-03-29: 1 via TRANSDERMAL
  Filled 2024-03-29: qty 1

## 2024-03-29 MED ORDER — METHOCARBAMOL 500 MG PO TABS: 500.0000 mg | ORAL_TABLET | Freq: Two times a day (BID) | ORAL | 0 refills | Status: DC

## 2024-03-29 NOTE — ED Triage Notes (Signed)
 Pt. Stated, Im having right neck and shoulder pain for a month. No injury

## 2024-03-29 NOTE — ED Provider Notes (Signed)
  EMERGENCY DEPARTMENT AT [facility] Provider Note   CSN: [id number] Arrival date & time: 03/29/24  8295     History  Chief Complaint  Patient presents with   Neck Pain   Shoulder Pain    Debra Patton is a 73 y.o. female with past medical history of T2DM, HLD, HTN presents to emergency department for evaluation of right neck and right arm pain for the past month that is worsened over the past couple days.  Pain is worsened with movement of right arm and neck.  Has tried Tylenol at home with some relief.  She denies no known injury, fevers, recent falls, CP, SHOB   Neck Pain Shoulder Pain Associated symptoms: neck pain        Home Medications Prior to Admission medications   Medication Sig Start Date End Date Taking? Authorizing Provider  acetaminophen (TYLENOL) 500 MG tablet Take 500 mg by mouth every 6 (six) hours as needed.    [provider]  aspirin-sod bicarb-citric acid (ALKA-SELTZER) 325 MG TBEF tablet Take 325 mg by mouth every 6 (six) hours as needed.    [provider]  cetirizine  (ZYRTEC ) 10 MG tablet Take 1 tablet (10 mg total) by mouth daily. 01/08/21   Zorita Hiss, FNP  diphenhydramine-acetaminophen (TYLENOL PM) 25-500 MG TABS tablet Take 1 tablet by mouth at bedtime as needed.    [provider]  fluticasone  (FLONASE ) 50 MCG/ACT nasal spray Place 2 sprays into both nostrils daily. 10/22/23   Gaylyn Keas, Mary-Margaret, FNP  ibuprofen  (ADVIL ) 800 MG tablet Take 1 tablet (800 mg total) by mouth 3 (three) times daily. 04/28/20   Wieters, Hallie C, PA-C  lisinopril -hydrochlorothiazide  (ZESTORETIC ) 10-12.5 MG tablet Take 1 tablet by mouth every other day. 03/20/24   Delfina Feller, FNP  mometasone  (ELOCON ) 0.1 % ointment APPLY TOPICALLY TWICE WEEKLY 03/28/24   Lillian Rein, MD  rosuvastatin  (CRESTOR ) 10 MG tablet Take 1 tablet (10 mg total) by mouth daily. 03/20/24   Delfina Feller, FNP      Allergies     Other and Ivp dye [iodinated contrast media]    Review of Systems   Review of Systems  Musculoskeletal:  Positive for neck pain.    Physical Exam Updated Vital Signs BP (!) 147/78 (BP Location: Right Arm)   Pulse 65   Temp 98.4 F (36.9 C) (Oral)   Resp 18   SpO2 100%  Physical Exam Vitals and nursing note reviewed.  Constitutional:      General: She is not in acute distress.    Appearance: Normal appearance.  HENT:     Head: Normocephalic and atraumatic.  Eyes:     Conjunctiva/sclera: Conjunctivae normal.  Neck:     Comments: No meningismus Cardiovascular:     Rate and Rhythm: Normal rate.     Pulses:          Radial pulses are 2+ on the right side and 2+ on the left side.  Pulmonary:     Effort: Pulmonary effort is normal. No respiratory distress.  Musculoskeletal:       Arms:     Cervical back: Neck supple. No edema, erythema, rigidity or crepitus. Pain with movement and muscular tenderness present. No spinous process tenderness. Decreased range of motion (2/2 pain).     Comments: Tight tense tricep muscle that is tender to touch.  Reproducible pain and tenderness to right trapezius and tricep muscle.  Pain worsens with left lateral flexion of neck and  movement of arm  Skin:    Coloration: Skin is not jaundiced or pale.  Neurological:     Mental Status: She is alert and oriented to person, place, and time. Mental status is at baseline.     GCS: GCS eye subscore is 4. GCS verbal subscore is 5. GCS motor subscore is 6.     Comments: Motor 5/5 of BUE.  Equal grip strength.  Sensation 2/2 and motor 5/5 of BUE and BLE.     ED Results / Procedures / Treatments   Labs (all labs ordered are listed, but only abnormal results are displayed) Labs Reviewed - No data to display  EKG None  Radiology No results found.  Procedures Procedures    Medications Ordered in ED Medications - No data to display  ED Course/ Medical Decision Making/ A&P                                  Medical Decision Making Risk Prescription drug management.   Patient presents to the ED for concern of neck pain and arm pain, this involves an extensive number of treatment options, and is a complaint that carries with it a high risk of complications and morbidity.  The differential diagnosis includes muscle strain, meningismus, ACS, DVT, ischemic limb, cellulitis   Co morbidities that complicate the patient evaluation  None   Additional history obtained:  Additional history obtained from Nursing   External records from outside source obtained and reviewed including triage RN note     Medicines ordered and prescription drug management:  I ordered medication including Robaxin, naproxen  for muscle strain Reevaluation of the patient after these medicines showed that the patient improved I have reviewed the patients home medicines and have made adjustments as needed     Problem List / ED Course:  Strain of neck muscle Muscle strain Pain is reproducible with movement and palpation so have low suspicion for ACS Neurologically intact with no motor or sensory deficit Extremities well-perfused with no signs of infection nor cellulitis Low suspicion for CVA/TIA as there is no neurodeficits Patient has ride home from her husband so will provide naproxen  and Robaxin for symptom relief.  I discussed other symptomatic management to include lidocaine patches, heat, cold, rest, Tylenol Discussed strict return precautions to include signs of meningismus, CVA/TIA, significant worsening symptoms, chest pain, and shortness of breath.  These precautions are also provided on DC paperwork.  She expressed understanding   Reevaluation:  After the interventions noted above, I reevaluated the patient and found that they have :improved     Dispostion:  After consideration of the diagnostic results and the patients response to treatment, I feel that the patent would benefit from  outpatient management PCP follow-up in 1 week for reassessment.   Discussed ED workup, disposition, return to ED precautions with patient who expresses understanding agrees with plan.  All questions answered to their satisfaction.  They are agreeable to plan.  Discharge instructions provided on paperwork  Final Clinical Impression(s) / ED Diagnoses Final diagnoses:  None    Rx / DC Orders ED Discharge Orders     None         Royann Cords, PA 03/29/24 2334    Albertus Hughs, DO 03/30/24 1501

## 2024-03-29 NOTE — Discharge Instructions (Addendum)
 Thank you for let us  evaluate you today.  It appears that you may have strained your arm and neck.  With protecting the arm the muscles have become tight and has worsened the pain.  We provided you with a muscle relaxer, strong ibuprofen , lidocaine patch Emergency Department for pain.  I have also sent a prescription for these. You may use naproxen  and Tylenol intermittently every 8 hours as needed for pain.  Please do not use naproxen  with aspirin, Aleve , ibuprofen , Advil  as they are all in the same family. Robaxin may cause drowsiness so do not operate heavy machinery including driving or drink alcohol with this.  You may take this at night or split the tablet in half if it makes you too drowsy.  You may also use heating or ice packs in areas and continue using Salonpas/lidocaine patches. Please limit use of right arm for next week + and continue massaging area.  Return to Emergency Department if you experience chest pain, shortness of breath, loss of sensation in your arm, weakness on one side your body.  Otherwise, follow-up with primary care in 7-10 days

## 2024-04-17 ENCOUNTER — Encounter (HOSPITAL_BASED_OUTPATIENT_CLINIC_OR_DEPARTMENT_OTHER): Payer: Self-pay | Admitting: Obstetrics & Gynecology

## 2024-04-17 ENCOUNTER — Ambulatory Visit (HOSPITAL_BASED_OUTPATIENT_CLINIC_OR_DEPARTMENT_OTHER): Payer: No Typology Code available for payment source | Admitting: Obstetrics & Gynecology

## 2024-04-17 VITALS — BP 117/63 | HR 77 | Ht 63.0 in | Wt 179.6 lb

## 2024-04-17 DIAGNOSIS — L280 Lichen simplex chronicus: Secondary | ICD-10-CM | POA: Diagnosis not present

## 2024-04-17 NOTE — Progress Notes (Signed)
   Vulvar recheck Patient name: Debra Patton MRN [id number]  Date of birth: 1951-02-06 Chief Complaint:   Vulvar recheck  History of Present Illness:   Debra Patton is a 73 y.o. G17P3002 African-American female being seen today for vulvar recheck.  H/o biopsy proven lichen simplex chronicus.  Has used topical steroids for symptom control but stopped using topical steroid as she felt she didn't need it.  Hasn't had any new itching.  Denies vaginal bleeding.  Really feeling good.  Pathology reviewed.    Last pap smear in 2016.  Pt thinks she may have had one in 2020 as well.  Last mammogram 03/2023.    No LMP recorded. Patient is postmenopausal.  Review of Systems:   Pertinent items are noted in HPI Denies any urinary symptoms Pertinent History Reviewed:  Reviewed past medical,surgical, social and family history.  Reviewed problem list, medications and allergies. Physical Assessment:   Vitals:   04/17/24 1302  BP: 117/63  Pulse: 77  Weight: 179 lb 9.6 oz (81.5 kg)  Height: 5\' 3"  (1.6 m)  Body mass index is 31.81 kg/m.        Physical Examination:   General appearance - well appearing, and in no distress  Mental status - alert, oriented to person, place, and time  Psych:  She has a normal mood and affect  Pelvic - VULVA: hypopigmentation of inner labia majora and inner labia minora, no excoriation or skin breakdown  VAGINA: normal appearing vagina with normal color and discharge, no lesions   Chaperone present for exam  Assessment & Plan:  1. Lichen simplex chronicus (Primary) - skin findings are stable.  No excoriations or skin breakdown.  Pt is going to continue monitoring conservatively.  Will recheck 1 year.  She will start using topical steroid if has new itching and will call for appt at that time.  Does not need RF for medication at this time.   Meds: No orders of the defined types were placed in this encounter.   Follow-up: Return in about 1 year (around  04/17/2025) for vuvar recheck.  Lillian Rein, MD 04/17/2024 1:36 PM

## 2024-05-02 IMAGING — MG MM DIGITAL SCREENING BILAT W/ TOMO AND CAD
8 series · 8 of 24 positions shown · non-contrast
Comparison: Previous exam(s).

CLINICAL DATA: Screening.

EXAM:
DIGITAL SCREENING BILATERAL MAMMOGRAM WITH TOMOSYNTHESIS AND CAD
TECHNIQUE: Bilateral screening digital craniocaudal and mediolateral oblique
mammograms were obtained. Bilateral screening digital breast
tomosynthesis was performed. The images were evaluated with
computer-aided detection.

[L MLO synth-2D]
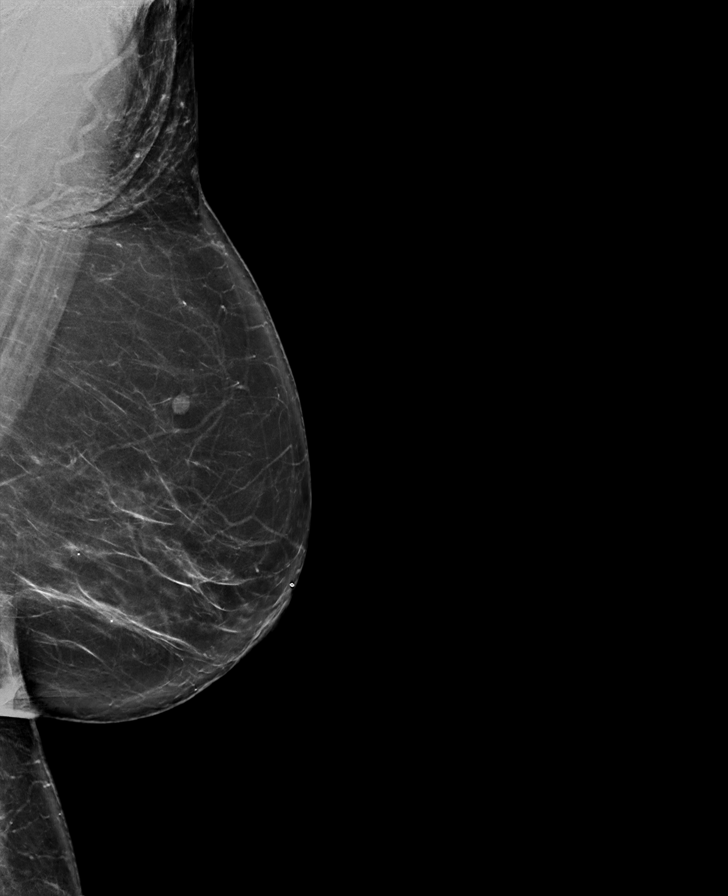

[L CC synth-2D]
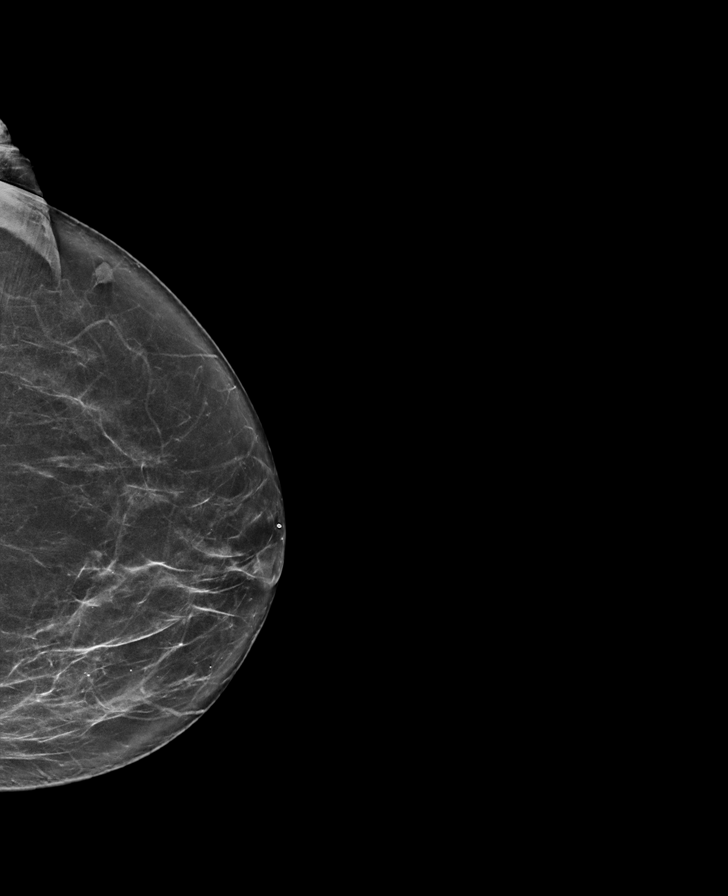

[R CC synth-2D]
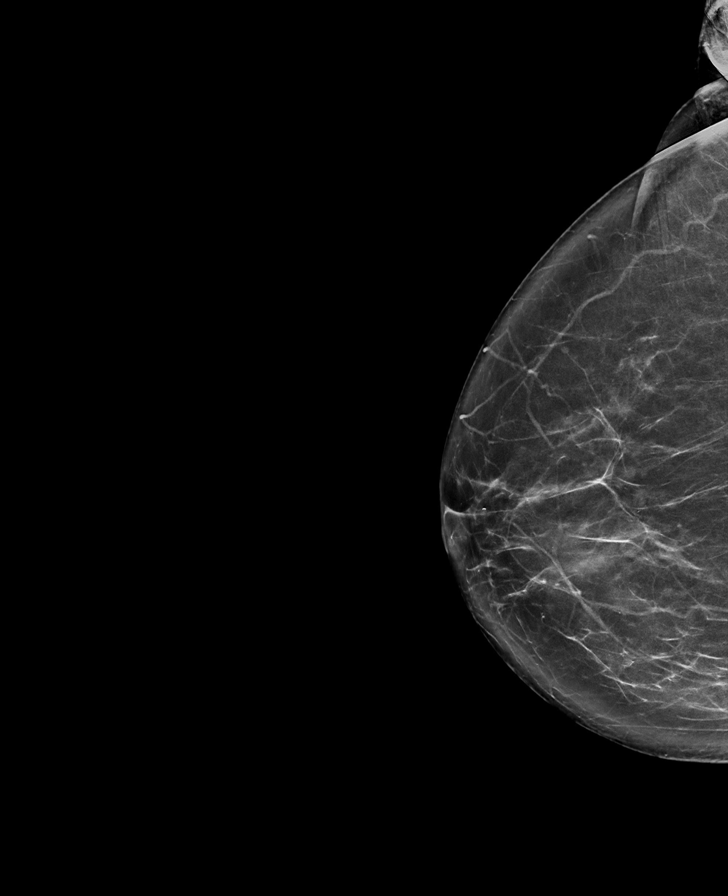

[R MLO synth-2D]
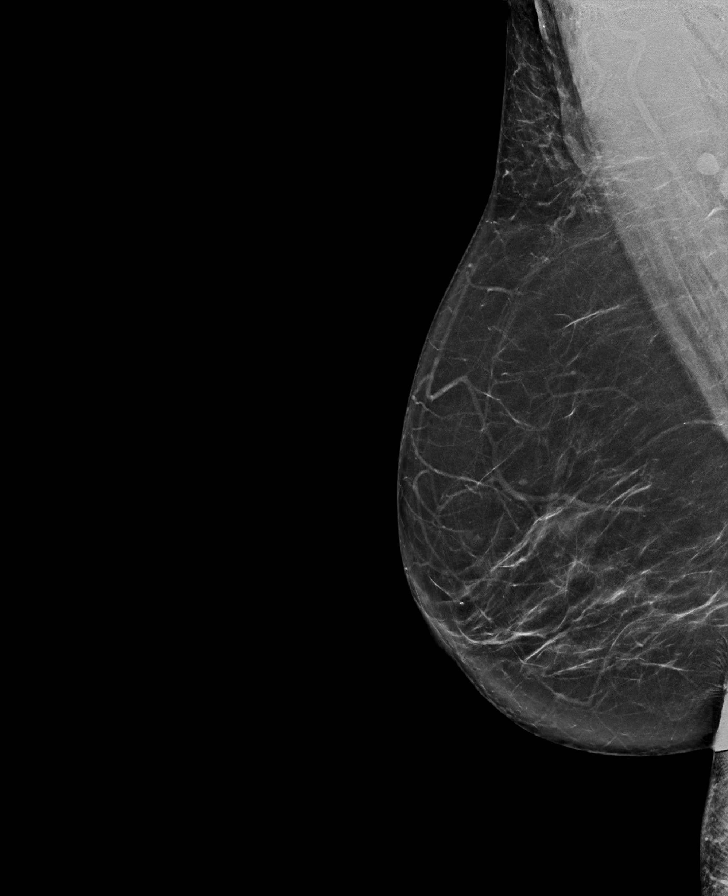

[R CC tomo · tomo slice 37/73.0]
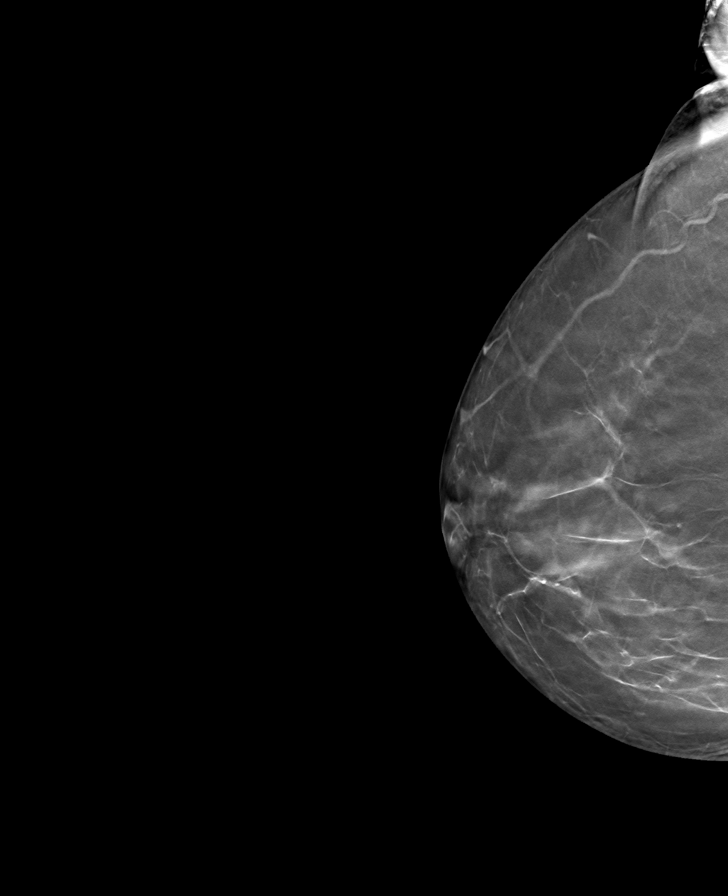

[L MLO tomo · tomo slice 41/82.0]
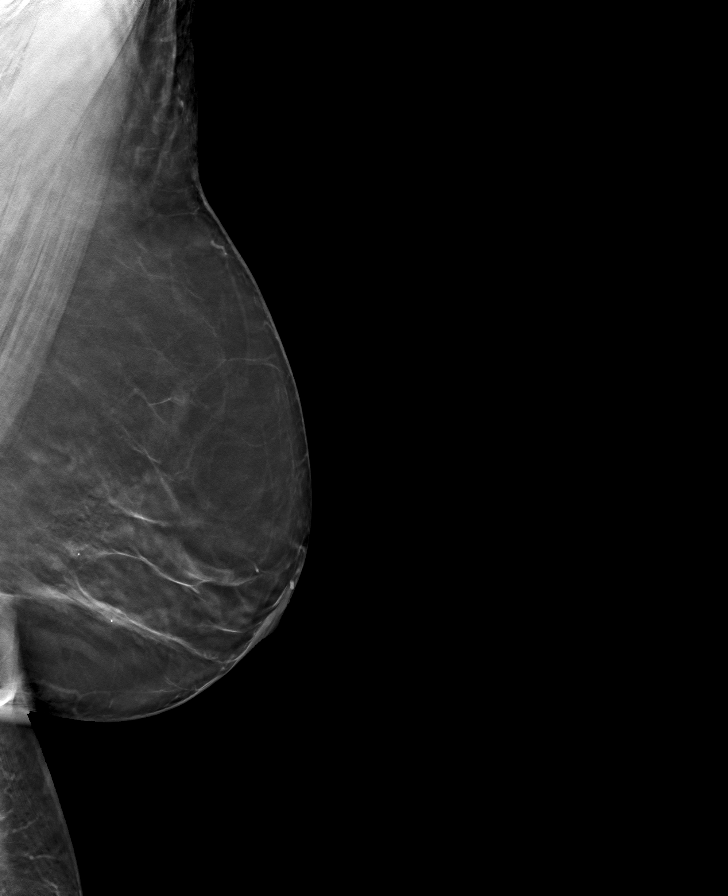

[R MLO tomo · tomo slice 39/76.0]
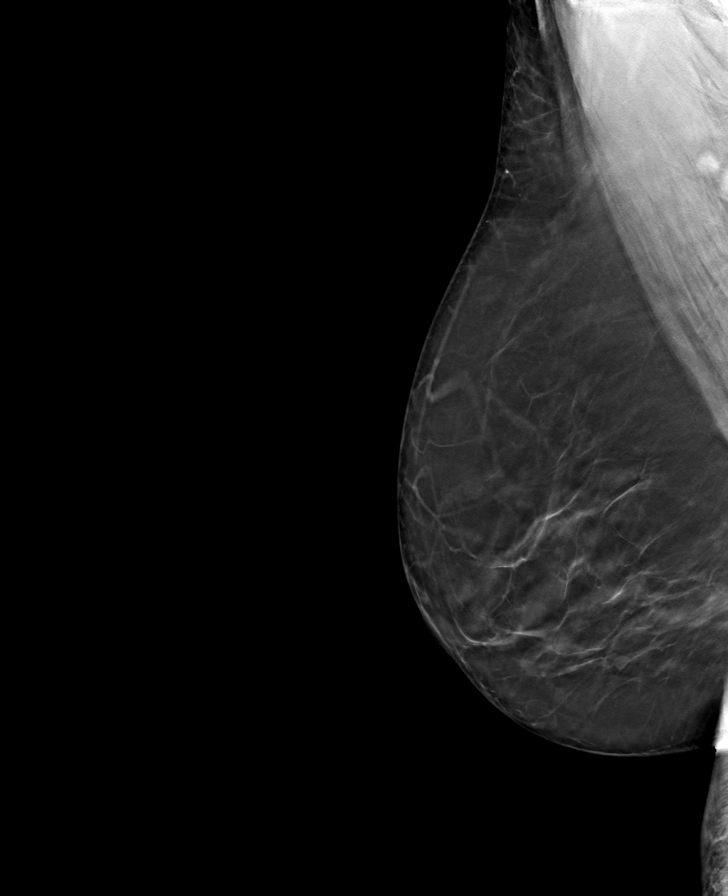

[L CC tomo · tomo slice 35/69.0]
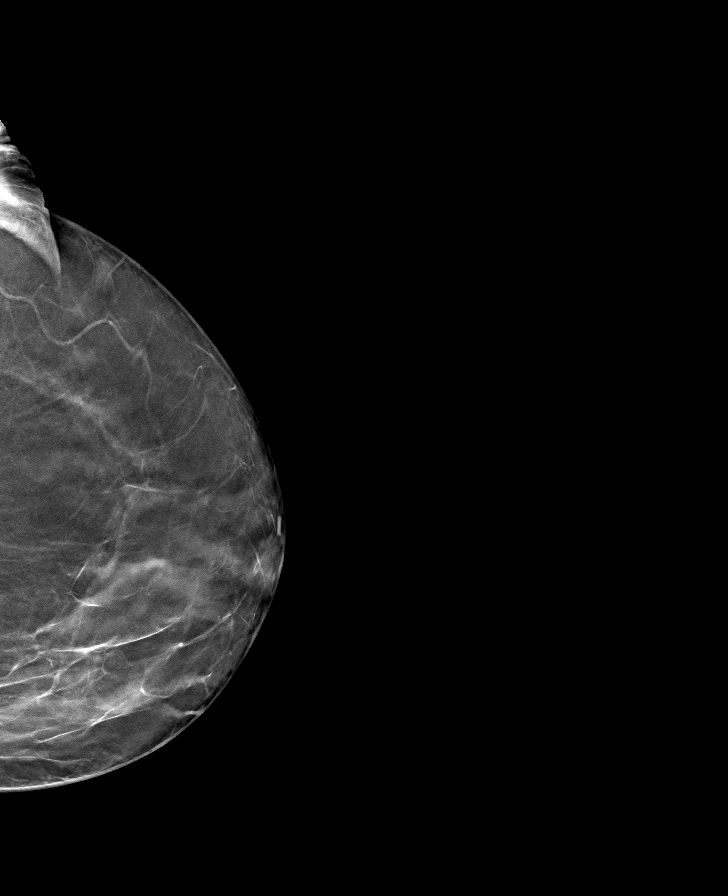

[8 of 24 positions shown; findings below may reference images not displayed]

ACR Breast Density Category b: There are scattered areas of
fibroglandular density.
FINDINGS: There are no findings suspicious for malignancy.
IMPRESSION: No mammographic evidence of malignancy. A result letter of this
screening mammogram will be mailed directly to the patient.

RECOMMENDATION:
Screening mammogram in one year. (Code:51-O-LD2)

BI-RADS CATEGORY  1: Negative.

## 2024-05-05 ENCOUNTER — Other Ambulatory Visit: Payer: Self-pay | Admitting: Nurse Practitioner

## 2024-05-05 DIAGNOSIS — I10 Essential (primary) hypertension: Secondary | ICD-10-CM

## 2024-05-30 ENCOUNTER — Ambulatory Visit (INDEPENDENT_AMBULATORY_CARE_PROVIDER_SITE_OTHER): Admitting: *Deleted

## 2024-05-30 DIAGNOSIS — E119 Type 2 diabetes mellitus without complications: Secondary | ICD-10-CM | POA: Diagnosis not present

## 2024-05-30 LAB — HM DIABETES EYE EXAM

## 2024-05-30 NOTE — Progress Notes (Signed)
 Debra Patton arrived 05/30/2024 and has given verbal consent to obtain images and complete their overdue diabetic retinal screening.  The images have been sent to an ophthalmologist or optometrist for review and interpretation.  Results will be sent back to Gladis Mustard, FNP for review.  Patient has been informed they will be contacted when we receive the results via telephone or MyChart

## 2024-07-05 DIAGNOSIS — Z6831 Body mass index (BMI) 31.0-31.9, adult: Secondary | ICD-10-CM | POA: Diagnosis not present

## 2024-07-05 DIAGNOSIS — E669 Obesity, unspecified: Secondary | ICD-10-CM | POA: Diagnosis not present

## 2024-07-05 DIAGNOSIS — E785 Hyperlipidemia, unspecified: Secondary | ICD-10-CM | POA: Diagnosis not present

## 2024-07-05 DIAGNOSIS — Z008 Encounter for other general examination: Secondary | ICD-10-CM | POA: Diagnosis not present

## 2024-07-05 DIAGNOSIS — I10 Essential (primary) hypertension: Secondary | ICD-10-CM | POA: Diagnosis not present

## 2024-07-11 ENCOUNTER — Other Ambulatory Visit: Payer: Self-pay | Admitting: *Deleted

## 2024-07-11 DIAGNOSIS — E1169 Type 2 diabetes mellitus with other specified complication: Secondary | ICD-10-CM

## 2024-07-11 MED ORDER — ROSUVASTATIN CALCIUM 10 MG PO TABS: 10.0000 mg | ORAL_TABLET | Freq: Every day | ORAL | 0 refills | Status: DC

## 2024-07-18 ENCOUNTER — Ambulatory Visit: Admitting: Nurse Practitioner

## 2024-09-02 ENCOUNTER — Other Ambulatory Visit: Payer: Self-pay | Admitting: Nurse Practitioner

## 2024-09-02 DIAGNOSIS — J3089 Other allergic rhinitis: Secondary | ICD-10-CM

## 2024-09-15 ENCOUNTER — Ambulatory Visit: Payer: Self-pay | Admitting: Nurse Practitioner

## 2024-09-15 ENCOUNTER — Encounter: Payer: Self-pay | Admitting: Nurse Practitioner

## 2024-09-15 VITALS — BP 124/70 | HR 81 | Temp 97.9°F | Ht 63.0 in | Wt 183.0 lb

## 2024-09-15 DIAGNOSIS — E1169 Type 2 diabetes mellitus with other specified complication: Secondary | ICD-10-CM | POA: Diagnosis not present

## 2024-09-15 DIAGNOSIS — I10 Essential (primary) hypertension: Secondary | ICD-10-CM | POA: Diagnosis not present

## 2024-09-15 DIAGNOSIS — E785 Hyperlipidemia, unspecified: Secondary | ICD-10-CM

## 2024-09-15 DIAGNOSIS — Z6832 Body mass index (BMI) 32.0-32.9, adult: Secondary | ICD-10-CM | POA: Diagnosis not present

## 2024-09-15 DIAGNOSIS — L280 Lichen simplex chronicus: Secondary | ICD-10-CM

## 2024-09-15 DIAGNOSIS — E119 Type 2 diabetes mellitus without complications: Secondary | ICD-10-CM

## 2024-09-15 LAB — BAYER DCA HB A1C WAIVED: HB A1C (BAYER DCA - WAIVED): 6.1 % — ABNORMAL HIGH (ref 4.8–5.6)

## 2024-09-15 LAB — LIPID PANEL

## 2024-09-15 MED ORDER — ROSUVASTATIN CALCIUM 10 MG PO TABS: 10.0000 mg | ORAL_TABLET | Freq: Every day | ORAL | 1 refills | Status: AC

## 2024-09-15 MED ORDER — MOMETASONE FUROATE 0.1 % EX OINT: TOPICAL_OINTMENT | CUTANEOUS | 3 refills | Status: AC

## 2024-09-15 MED ORDER — METHOCARBAMOL 500 MG PO TABS: 500.0000 mg | ORAL_TABLET | Freq: Two times a day (BID) | ORAL | 1 refills | Status: AC

## 2024-09-15 MED ORDER — LISINOPRIL-HYDROCHLOROTHIAZIDE 10-12.5 MG PO TABS: 1.0000 | ORAL_TABLET | Freq: Every day | ORAL | 1 refills | Status: AC

## 2024-09-15 MED ORDER — NAPROXEN 500 MG PO TABS: 500.0000 mg | ORAL_TABLET | Freq: Two times a day (BID) | ORAL | 0 refills | Status: AC

## 2024-09-15 NOTE — Progress Notes (Signed)
 Subjective:    Patient ID: Debra Patton, female    DOB: 02-21-1951, 73 y.o.   MRN: [id number]   Chief Complaint: medical management of chronic issues     HPI:  Debra Patton is a 73 y.o. who identifies as a female who was assigned female at birth.   Social history: Lives with: by herself- family checks on her daily Work history: retired   Water Engineer in today for follow up of the following chronic medical issues:  1. Essential hypertension No c/o chest pain, sob  or headache. Does not check blood pressure at home. BP Readings from Last 3 Encounters:  04/17/24 117/63  03/29/24 (!) 147/78  03/20/24 (!) 149/71     2. Diet-controlled diabetes mellitus (HCC) Does check blood sugar 2-3x a day. Is running around 80-160. Denies any lows Lab Results  Component Value Date   HGBA1C 5.8 (H) 03/20/2024     3. Hyperlipidemia associated with type 2 diabetes mellitus (HCC) Does not watch diet and does no exercise. Is on crestor  10mg  daily., but she doe snot take it daily. Lab Results  Component Value Date   CHOL 148 03/20/2024   HDL 49 03/20/2024   LDLCALC 78 03/20/2024   TRIG 120 03/20/2024   CHOLHDL 3.0 03/20/2024   The 10-year ASCVD risk score (Arnett DK, et al., 2019) is: 18.3%   4. BMI 32.0-32.9,adult Weight is up 4 lbs  Wt Readings from Last 3 Encounters:  09/15/24 183 lb (83 kg)  04/17/24 179 lb 9.6 oz (81.5 kg)  03/20/24 179 lb (81.2 kg)   BMI Readings from Last 3 Encounters:  09/15/24 32.42 kg/m  04/17/24 31.81 kg/m  03/20/24 31.71 kg/m  ,      New complaints: None today  Allergies  Allergen Reactions   Other Anaphylaxis   Ivp Dye [Iodinated Contrast Media]    Outpatient Encounter Medications as of 09/15/2024  Medication Sig   acetaminophen (TYLENOL) 500 MG tablet Take 500 mg by mouth every 6 (six) hours as needed.   aspirin-sod bicarb-citric acid (ALKA-SELTZER) 325 MG TBEF tablet Take 325 mg by mouth every 6 (six) hours as needed.    cetirizine  (ZYRTEC ) 10 MG tablet Take 1 tablet (10 mg total) by mouth daily.   diphenhydramine-acetaminophen (TYLENOL PM) 25-500 MG TABS tablet Take 1 tablet by mouth at bedtime as needed.   fluticasone  (FLONASE ) 50 MCG/ACT nasal spray SPRAY 2 SPRAYS INTO EACH NOSTRIL EVERY DAY   ibuprofen  (ADVIL ) 800 MG tablet Take 1 tablet (800 mg total) by mouth 3 (three) times daily.   lisinopril -hydrochlorothiazide  (ZESTORETIC ) 10-12.5 MG tablet TAKE 1 TABLET BY MOUTH EVERY DAY   methocarbamol  (ROBAXIN ) 500 MG tablet Take 1 tablet (500 mg total) by mouth 2 (two) times daily.   mometasone  (ELOCON ) 0.1 % ointment APPLY TOPICALLY TWICE WEEKLY   naproxen  (NAPROSYN ) 500 MG tablet Take 1 tablet (500 mg total) by mouth 2 (two) times daily.   rosuvastatin  (CRESTOR ) 10 MG tablet Take 1 tablet (10 mg total) by mouth daily.   No facility-administered encounter medications on file as of 09/15/2024.    Past Surgical History:  Procedure Laterality Date   CARPAL TUNNEL RELEASE     CHOLECYSTECTOMY     NECK SURGERY     TUBAL LIGATION     TUMOR REMOVAL Left 2016   Neck     Family History  Problem Relation Age of Onset   Hypertension Mother    Diabetes Mother    Stroke Mother  Vision loss Mother    Asthma Mother    Alcohol abuse Father    Cancer Father    Stroke Father    Hypertension Sister    Diabetes Sister    Hypertension Sister    Early death Brother    Cancer Brother        Brain tumor    Cancer Brother    Early death Brother    Diabetes Brother    Hypertension Brother    Hypertension Brother    Cancer Brother    Diabetes Brother    Hypertension Brother    Cancer Brother    Diabetes Brother    Hypertension Brother    Vision loss Brother    Stroke Brother    Breast cancer Neg Hx       Controlled substance contract: n/a     Review of Systems  Constitutional:  Negative for diaphoresis.  Eyes:  Negative for pain.  Respiratory:  Negative for shortness of breath.   Cardiovascular:   Negative for chest pain, palpitations and leg swelling.  Gastrointestinal:  Negative for abdominal pain.  Endocrine: Negative for polydipsia.  Skin:  Negative for rash.  Neurological:  Negative for dizziness, weakness and headaches.  Hematological:  Does not bruise/bleed easily.  All other systems reviewed and are negative.      Objective:   Physical Exam Vitals and nursing note reviewed.  Constitutional:      General: She is not in acute distress.    Appearance: Normal appearance. She is well-developed.  HENT:     Head: Normocephalic.     Right Ear: Tympanic membrane normal.     Left Ear: Tympanic membrane normal.     Nose: Nose normal.     Mouth/Throat:     Mouth: Mucous membranes are moist.  Eyes:     Pupils: Pupils are equal, round, and reactive to light.  Neck:     Vascular: No carotid bruit or JVD.  Cardiovascular:     Rate and Rhythm: Normal rate and regular rhythm.     Heart sounds: Normal heart sounds.  Pulmonary:     Effort: Pulmonary effort is normal. No respiratory distress.     Breath sounds: Normal breath sounds. No wheezing or rales.  Chest:     Chest wall: No tenderness.  Abdominal:     General: Bowel sounds are normal. There is no distension or abdominal bruit.     Palpations: Abdomen is soft. There is no hepatomegaly, splenomegaly, mass or pulsatile mass.     Tenderness: There is no abdominal tenderness.  Musculoskeletal:        General: Normal range of motion.     Cervical back: Normal range of motion and neck supple.  Lymphadenopathy:     Cervical: No cervical adenopathy.  Skin:    General: Skin is warm and dry.  Neurological:     Mental Status: She is alert and oriented to person, place, and time.     Deep Tendon Reflexes: Reflexes are normal and symmetric.  Psychiatric:        Behavior: Behavior normal.        Thought Content: Thought content normal.        Judgment: Judgment normal.     BP 124/70   Pulse 81   Temp 97.9 F (36.6 C)  (Temporal)   Ht 5' 3 (1.6 m)   Wt 183 lb (83 kg)   SpO2 95%   BMI 32.42 kg/m  HGBA1c 6.1%      Assessment & Plan:   Debra Patton comes in today with chief complaint of medical management of chronic issues    Diagnosis and orders addressed:  1. Essential hypertension Low sodium diet - CBC with Differential/Platelet - CMP14+EGFR - lisinopril -hydrochlorothiazide  (ZESTORETIC ) 10-12.5 MG tablet; Take 1 tablet by mouth daily.  Dispense: 90 tablet; Refill: 1  2. Diet-controlled diabetes mellitus (HCC) Continue to watch carbs in diet - Bayer DCA Hb A1c Waived - Microalbumin / creatinine urine ratio  3. Hyperlipidemia associated with type 2 diabetes mellitus (HCC) Low fat diet - Lipid panel  4. BMI 32.0-32.9,adult Discussed diet and exercise for person with BMI >25 Will recheck weight in 3-6 months    Labs pending Health Maintenance reviewed Diet and exercise encouraged  Follow up plan: 6 months   Mary-Margaret Gladis, FNP

## 2024-09-16 LAB — MICROALBUMIN / CREATININE URINE RATIO
Creatinine, Urine: 85.9 mg/dL
Microalb/Creat Ratio: 4 mg/g{creat} (ref 0–29)
Microalbumin, Urine: 3.3 ug/mL

## 2024-09-16 LAB — CMP14+EGFR
ALT: 8 IU/L (ref 0–32)
AST: 12 IU/L (ref 0–40)
Albumin: 4.3 g/dL (ref 3.8–4.8)
Alkaline Phosphatase: 96 IU/L (ref 49–135)
BUN/Creatinine Ratio: 18 (ref 12–28)
BUN: 14 mg/dL (ref 8–27)
Bilirubin Total: 0.2 mg/dL (ref 0.0–1.2)
CO2: 24 mmol/L (ref 20–29)
Calcium: 9.6 mg/dL (ref 8.7–10.3)
Chloride: 101 mmol/L (ref 96–106)
Creatinine, Ser: 0.78 mg/dL (ref 0.57–1.00)
Globulin, Total: 3.2 g/dL (ref 1.5–4.5)
Glucose: 78 mg/dL (ref 70–99)
Potassium: 3.9 mmol/L (ref 3.5–5.2)
Sodium: 141 mmol/L (ref 134–144)
Total Protein: 7.5 g/dL (ref 6.0–8.5)
eGFR: 81 mL/min/1.73 (ref >59)

## 2024-09-16 LAB — CBC WITH DIFFERENTIAL/PLATELET
Basophils Absolute: 0 x10E3/uL (ref 0.0–0.2)
Basos: 0 %
EOS (ABSOLUTE): 0.4 x10E3/uL (ref 0.0–0.4)
Eos: 6 %
Hematocrit: 34.9 % (ref 34.0–46.6)
Hemoglobin: 11.2 g/dL (ref 11.1–15.9)
Immature Grans (Abs): 0 x10E3/uL (ref 0.0–0.1)
Immature Granulocytes: 0 %
Lymphocytes Absolute: 2 x10E3/uL (ref 0.7–3.1)
Lymphs: 29 %
MCH: 26.7 pg (ref 26.6–33.0)
MCHC: 32.1 g/dL (ref 31.5–35.7)
MCV: 83 fL (ref 79–97)
Monocytes Absolute: 0.5 x10E3/uL (ref 0.1–0.9)
Monocytes: 8 %
Neutrophils Absolute: 3.9 x10E3/uL (ref 1.4–7.0)
Neutrophils: 57 %
Platelets: 392 x10E3/uL (ref 150–450)
RBC: 4.2 x10E6/uL (ref 3.77–5.28)
RDW: 12.8 % (ref 11.7–15.4)
WBC: 6.9 x10E3/uL (ref 3.4–10.8)

## 2024-09-16 LAB — LIPID PANEL
Chol/HDL Ratio: 3.5 ratio (ref 0.0–4.4)
Cholesterol, Total: 155 mg/dL (ref 100–199)
HDL: 44 mg/dL (ref >39)
LDL Chol Calc (NIH): 85 mg/dL (ref 0–99)
Triglycerides: 150 mg/dL — ABNORMAL HIGH (ref 0–149)
VLDL Cholesterol Cal: 26 mg/dL (ref 5–40)

## 2024-09-18 ENCOUNTER — Ambulatory Visit: Payer: Self-pay | Admitting: Nurse Practitioner

## 2025-03-13 ENCOUNTER — Ambulatory Visit: Admitting: Nurse Practitioner

## 2025-03-14 ENCOUNTER — Ambulatory Visit: Payer: Self-pay
